# Patient Record
Sex: Male | Born: 1945 | Race: White | Hispanic: No | Smoking: Never smoker
Health system: Southern US, Community
[De-identification: ages and names within clinical notes are randomized; demographics above are authoritative.]

## PROBLEM LIST (undated history)

## (undated) DIAGNOSIS — G44309 Post-traumatic headache, unspecified, not intractable: Secondary | ICD-10-CM

## (undated) DIAGNOSIS — F419 Anxiety disorder, unspecified: Secondary | ICD-10-CM

## (undated) DIAGNOSIS — E785 Hyperlipidemia, unspecified: Secondary | ICD-10-CM

## (undated) DIAGNOSIS — I639 Cerebral infarction, unspecified: Secondary | ICD-10-CM

## (undated) DIAGNOSIS — F329 Major depressive disorder, single episode, unspecified: Secondary | ICD-10-CM

## (undated) DIAGNOSIS — H9190 Unspecified hearing loss, unspecified ear: Secondary | ICD-10-CM

## (undated) DIAGNOSIS — R630 Anorexia: Secondary | ICD-10-CM

## (undated) DIAGNOSIS — K5909 Other constipation: Secondary | ICD-10-CM

## (undated) DIAGNOSIS — K529 Noninfective gastroenteritis and colitis, unspecified: Secondary | ICD-10-CM

## (undated) DIAGNOSIS — K635 Polyp of colon: Secondary | ICD-10-CM

## (undated) DIAGNOSIS — I10 Essential (primary) hypertension: Secondary | ICD-10-CM

## (undated) DIAGNOSIS — F32A Depression, unspecified: Secondary | ICD-10-CM

## (undated) DIAGNOSIS — G4700 Insomnia, unspecified: Secondary | ICD-10-CM

## (undated) DIAGNOSIS — K219 Gastro-esophageal reflux disease without esophagitis: Secondary | ICD-10-CM

## (undated) DIAGNOSIS — S0990XS Unspecified injury of head, sequela: Secondary | ICD-10-CM

## (undated) DIAGNOSIS — M1990 Unspecified osteoarthritis, unspecified site: Secondary | ICD-10-CM

## (undated) DIAGNOSIS — R1310 Dysphagia, unspecified: Secondary | ICD-10-CM

## (undated) HISTORY — DX: Unspecified injury of head, sequela: G44.309

## (undated) HISTORY — DX: Gastro-esophageal reflux disease without esophagitis: K21.9

## (undated) HISTORY — PX: TONSILLECTOMY: SUR1361

## (undated) HISTORY — DX: Anorexia: R63.0

## (undated) HISTORY — DX: Hyperlipidemia, unspecified: E78.5

## (undated) HISTORY — DX: Other constipation: K59.09

## (undated) HISTORY — DX: Unspecified hearing loss, unspecified ear: H91.90

## (undated) HISTORY — PX: WISDOM TOOTH EXTRACTION: SHX21

## (undated) HISTORY — DX: Dysphagia, unspecified: R13.10

## (undated) HISTORY — DX: Unspecified injury of head, sequela: S09.90XS

## (undated) HISTORY — DX: Depression, unspecified: F32.A

## (undated) HISTORY — DX: Unspecified osteoarthritis, unspecified site: M19.90

## (undated) HISTORY — DX: Insomnia, unspecified: G47.00

## (undated) HISTORY — DX: Major depressive disorder, single episode, unspecified: F32.9

## (undated) HISTORY — DX: Polyp of colon: K63.5

## (undated) HISTORY — DX: Anxiety disorder, unspecified: F41.9

---

## 2009-05-24 DIAGNOSIS — M25559 Pain in unspecified hip: Secondary | ICD-10-CM

## 2011-02-20 DIAGNOSIS — M79606 Pain in leg, unspecified: Secondary | ICD-10-CM | POA: Insufficient documentation

## 2012-05-07 ENCOUNTER — Emergency Department (HOSPITAL_COMMUNITY)
Admission: EM | Admit: 2012-05-07 | Discharge: 2012-05-07 | Disposition: A | Discharge status: Home / Self Care | Payer: Medicare Other | Attending: Emergency Medicine | Admitting: Emergency Medicine

## 2012-05-07 ENCOUNTER — Encounter (HOSPITAL_COMMUNITY): Payer: Self-pay | Admitting: Emergency Medicine

## 2012-05-07 DIAGNOSIS — I10 Essential (primary) hypertension: Secondary | ICD-10-CM | POA: Insufficient documentation

## 2012-05-07 DIAGNOSIS — R51 Headache: Secondary | ICD-10-CM | POA: Insufficient documentation

## 2012-05-07 DIAGNOSIS — Z8673 Personal history of transient ischemic attack (TIA), and cerebral infarction without residual deficits: Secondary | ICD-10-CM | POA: Insufficient documentation

## 2012-05-07 HISTORY — DX: Cerebral infarction, unspecified: I63.9

## 2012-05-07 HISTORY — DX: Essential (primary) hypertension: I10

## 2012-05-07 HISTORY — DX: Noninfective gastroenteritis and colitis, unspecified: K52.9

## 2012-05-07 MED ORDER — AMLODIPINE BESYLATE 5 MG PO TABS
5.0000 mg | ORAL_TABLET | ORAL | Status: AC
  Administered 2012-05-07: 5 mg via ORAL
  Filled 2012-05-07: qty 1

## 2012-05-07 MED ORDER — AMLODIPINE BESYLATE 5 MG PO TABS: 5.0000 mg | ORAL_TABLET | Freq: Every day | ORAL | Status: DC

## 2012-05-07 MED ORDER — OXYCODONE-ACETAMINOPHEN 5-325 MG PO TABS
1.0000 | ORAL_TABLET | Freq: Once | ORAL | Status: AC
  Administered 2012-05-07: 1 via ORAL
  Filled 2012-05-07: qty 1

## 2012-05-07 NOTE — ED Notes (Signed)
Patient reports blood pressure has become high over the last month.  Patient saw primary care provider yesterday and PCP told pt. To come to ED.  Pt. Took his BP this morning and it was 205/109.  Pt. Started zoloft prescription last week for two days and developed a rash and shortness of breath.  Pt. Stopped taking Zoloft after two days.  Pt. Reports worsening tinnitus and headache.

## 2012-05-07 NOTE — ED Notes (Addendum)
Pt reports HTN that "has been giving me problems over last few weeks." C/o HA, ringing in ears. Sts has been running higher than usual over last few days. 205/103 this am. Also reports pulse 95, sts usually is 65-70. Also reports back pain, which is not new per pt.

## 2012-05-07 NOTE — Progress Notes (Signed)
Pt confirms pcp as michael badger

## 2012-05-07 NOTE — ED Provider Notes (Signed)
History     CSN: [id number]  Arrival date & time 05/07/12  1201   First MD Initiated Contact with Patient 05/07/12 1606      Chief Complaint  Patient presents with  . Hypertension    The history is provided by the patient.   the patient reports high blood pressure yesterday and today when monitoring it at home.  He is taking his Cozaar as prescribed.  He reports he exercises daily and takes a low-sodium diet.  He denies chest pain shortness of breath.  He reports mild headache but reports the headache is not atypical for him.  He denies weakness of his upper lower extremities.  His had no gait instability.  He denies abdominal pain or back pain.  He recently finished a course of antibiotics for possible pneumonia.  His blood pressure at home today was 205/109.  He is asymptomatic at that time.  Blood pressure on arrival to the emergency apartment was 194/99.  Patient without current plates at this time  Past Medical History  Diagnosis Date  . Hypertension   . Stroke   . Colitis     Past Surgical History  Procedure Date  . Tonsillectomy     Family History  Problem Relation Age of Onset  . Cancer Father   . Heart failure Father     History  Substance Use Topics  . Smoking status: Never Smoker   . Smokeless tobacco: Not on file  . Alcohol Use: 1.2 oz/week    2 Cans of beer per week      Review of Systems  All other systems reviewed and are negative.    Allergies  Zoloft and Statins  Home Medications   Current Outpatient Rx  Name Route Sig Dispense Refill  . ASPIRIN 81 MG PO CHEW Oral Chew 81 mg by mouth daily.    Marland Kitchen BACLOFEN 10 MG PO TABS Oral Take 10 mg by mouth 4 (four) times daily.    Marland Kitchen CLONAZEPAM 0.5 MG PO TABS Oral Take 0.5 mg by mouth 2 (two) times daily as needed. For anxiety    . CLOPIDOGREL BISULFATE 75 MG PO TABS Oral Take 75 mg by mouth every evening.    . CO Q 10 100 MG PO CAPS Oral Take 1 capsule by mouth 2 (two) times daily.    Marland Kitchen DOCUSATE SODIUM  100 MG PO CAPS Oral Take 100 mg by mouth daily as needed. For constipation with vicodin    . GABAPENTIN 100 MG PO CAPS Oral Take 100 mg by mouth 3 (three) times daily.    Marland Kitchen HYDROCODONE-ACETAMINOPHEN 5-500 MG PO TABS Oral Take 1 tablet by mouth every 4 (four) hours as needed. For pain    . LOSARTAN POTASSIUM 100 MG PO TABS Oral Take 100 mg by mouth every morning.    Marland Kitchen PROSTATE HEALTH PO CAPS Oral Take 1 capsule by mouth 2 (two) times daily.    . ADULT MULTIVITAMIN W/MINERALS CH Oral Take 1 tablet by mouth daily.    . NYSTATIN 100000 UNIT/GM EX CREA Topical Apply 1 application topically 2 (two) times daily. Apply to sores on feet    . SALMON OIL-1000 PO Oral Take 1,000 mg by mouth daily.    Marland Kitchen OVER THE COUNTER MEDICATION Oral Take 1 capsule by mouth daily. Gamma Tocopherol: vit E 200iu, d-gamma750mg , d-delta 290mg     . OVER THE COUNTER MEDICATION Oral Take 1 capsule by mouth daily. Veg Cap: Beta-Sitosterol with zinc and selenium    .  RED YEAST RICE EXTRACT 600 MG PO CAPS Oral Take 1 capsule by mouth 2 (two) times daily.    Marland Kitchen ZOLPIDEM TARTRATE 10 MG PO TABS Oral Take 10 mg by mouth at bedtime as needed. For sleep    . AMLODIPINE BESYLATE 5 MG PO TABS Oral Take 1 tablet (5 mg total) by mouth daily. 30 tablet 0    BP 186/93  Pulse 63  Temp 98 F (36.7 C) (Oral)  Resp 18  Ht 5\' 11"  (1.803 m)  Wt 145 lb (65.772 kg)  BMI 20.22 kg/m2  SpO2 99%  Physical Exam  Nursing note and vitals reviewed. Constitutional: He is oriented to person, place, and time. He appears well-developed and well-nourished.  HENT:  Head: Normocephalic and atraumatic.  Eyes: EOM are normal.  Neck: Normal range of motion.  Cardiovascular: Normal rate, regular rhythm, normal heart sounds and intact distal pulses.   Pulmonary/Chest: Effort normal and breath sounds normal. No respiratory distress.  Abdominal: Soft. He exhibits no distension. There is no tenderness.  Musculoskeletal: Normal range of motion.  Neurological: He  is alert and oriented to person, place, and time.  Skin: Skin is warm and dry.  Psychiatric: He has a normal mood and affect. Judgment normal.    ED Course  Procedures (including critical care time)  Labs Reviewed - No data to display No results found.   1. Hypertension       MDM  The patient has hypertension without clinical evidence of endorgan damage.  I'll start the patient on Norvasc in addition to his Cozaar.  PCP followup next week for blood pressure recheck.        Lyanne Co, MD 05/07/12 [phone]

## 2012-08-16 ENCOUNTER — Encounter: Payer: Self-pay | Admitting: Physical Medicine & Rehabilitation

## 2012-09-10 ENCOUNTER — Encounter: Payer: Self-pay | Admitting: Physical Medicine & Rehabilitation

## 2012-09-10 ENCOUNTER — Ambulatory Visit (HOSPITAL_BASED_OUTPATIENT_CLINIC_OR_DEPARTMENT_OTHER): Payer: Medicare Other | Admitting: Physical Medicine & Rehabilitation

## 2012-09-10 ENCOUNTER — Encounter: Payer: Medicare Other | Attending: Physical Medicine & Rehabilitation

## 2012-09-10 VITALS — BP 156/99 | HR 74 | Resp 14 | Ht 71.0 in | Wt 144.0 lb

## 2012-09-10 DIAGNOSIS — G890 Central pain syndrome: Secondary | ICD-10-CM

## 2012-09-10 DIAGNOSIS — Z5181 Encounter for therapeutic drug level monitoring: Secondary | ICD-10-CM

## 2012-09-10 DIAGNOSIS — I69959 Hemiplegia and hemiparesis following unspecified cerebrovascular disease affecting unspecified side: Secondary | ICD-10-CM | POA: Insufficient documentation

## 2012-09-10 DIAGNOSIS — G8110 Spastic hemiplegia affecting unspecified side: Secondary | ICD-10-CM

## 2012-09-10 DIAGNOSIS — R52 Pain, unspecified: Secondary | ICD-10-CM

## 2012-09-10 MED ORDER — TRAMADOL HCL 50 MG PO TABS: 50.0000 mg | ORAL_TABLET | Freq: Four times a day (QID) | ORAL | Status: DC | PRN

## 2012-09-10 NOTE — Progress Notes (Signed)
Subjective:    Patient ID: Carlos Grimes, male    DOB: 1946/03/27, 66 y.o.   MRN: [id number] CC Right arm pain since stroke HPI Secondary complaint is right ankle stiffness since stroke Stroke onset 01/02/2005.  Treated in [facility] Inpatient rehabilitation for 20 days Had outpatient therapy for a time after discharge from rehabilitation unit Complaints of spasticity on the right side Ambulates with a cane Has not fallen in years  Tried gabapentin no effect, had bad dreams Amitriptyline causing bowel and urination trouble   Pain Inventory Average Pain 4 Pain Right Now 8 My pain is intermittent, constant, sharp, dull, stabbing, tingling and aching  In the last 24 hours, has pain interfered with the following? General activity 7 Relation with others 3 Enjoyment of life 8 What TIME of day is your pain at its worst? evening and night Sleep (in general) Fair  Pain is worse with: some activites Pain improves with: rest and medication Relief from Meds: 6  Mobility use a cane how many minutes can you walk? 15 ability to climb steps?  yes do you drive?  yes transfers alone Do you have any goals in this area?  no  Function disabled: date disabled 04/08 I need assistance with the following:  meal prep, household duties and shopping Do you have any goals in this area?  yes  Neuro/Psych bladder control problems weakness numbness tremor tingling trouble walking spasms depression anxiety suicidal thoughts  Prior Studies CT/MRI  Physicians involved in your care Primary care    Family History  Problem Relation Age of Onset  . Cancer Father   . Heart failure Father   . Alcohol abuse Father    History   Social History  . Marital Status: Married    Spouse Name: N/A    Number of Children: N/A  . Years of Education: N/A   Social History Main Topics  . Smoking status: Never Smoker   . Smokeless tobacco: Never Used  . Alcohol Use: 1.2 oz/week    2  Cans of beer per week  . Drug Use: No  . Sexually Active: None   Other Topics Concern  . None   Social History Narrative  . None   Past Surgical History  Procedure Date  . Tonsillectomy   . Wisdom tooth extraction    Past Medical History  Diagnosis Date  . Hypertension   . Stroke   . Colitis   . Depression   . Arthritis    BP 156/99  Pulse 74  Resp 14  Ht 5\' 11"  (1.803 m)  Wt 144 lb (65.318 kg)  BMI 20.08 kg/m2  SpO2 99%     Review of Systems  Musculoskeletal: Positive for gait problem.  Neurological: Positive for tremors, weakness and numbness.  Psychiatric/Behavioral: Positive for suicidal ideas and dysphoric mood. The patient is nervous/anxious.   All other systems reviewed and are negative.       Objective:   Physical Exam  Constitutional: He is oriented to person, place, and time. He appears well-developed and well-nourished.  HENT:  Head: Normocephalic and atraumatic.  Eyes: Conjunctivae normal and EOM are normal. Pupils are equal, round, and reactive to light.  Neck: Normal range of motion. Neck supple.  Cardiovascular: Normal rate and regular rhythm.   Pulmonary/Chest: Effort normal and breath sounds normal.  Abdominal: Soft. Bowel sounds are normal.  Neurological: He is alert and oriented to person, place, and time.  Reflex Scores:      Tricep reflexes are  3+ on the right side and 2+ on the left side.      Bicep reflexes are 3+ on the right side and 2+ on the left side.      Brachioradialis reflexes are 3+ on the right side and 2+ on the left side.      Patellar reflexes are 3+ on the right side and 2+ on the left side.      Achilles reflexes are 3+ on the right side and 2+ on the left side.      4/5 R Delt, bi , tri, grip, HF, KE, ankle DF/PF 5/5 on Left side Sensation reduced on the RIght side ambulation  Psychiatric: He has a normal mood and affect.          Assessment & Plan:  1. 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 Do not think narcotic analgesics are the best treatment option for this patient. First there is really no literature to support this, second he has elevated opioid risk total score, third he's had complications with constipation. Recommend combination treatment tramadol 50 mg 3 times a day, baclofen 10 mg 3 times a day May need to adjust from there. Zoloft listed as allergy However patient is tolerating amitriptyline which has some serotonergic effect  I'll see him in 3 weeks. He is to call if he has any intolerance of the tramadol He may stop the hydrocodone. He is on such a small dose 2.5 mg twice a day, and that I do not anticipate any withdrawal reaction

## 2012-09-10 NOTE — Patient Instructions (Addendum)
Please call if you cannot tolerate your medication change Please take your baclofen 3 times per day every day I can refill your baclofen once he starts running out Your family doctor will take care of all your other medicines I do not recommend hydrocodone.

## 2012-10-04 ENCOUNTER — Encounter: Payer: Self-pay | Admitting: Physical Medicine & Rehabilitation

## 2012-10-04 ENCOUNTER — Encounter: Payer: Medicare Other | Attending: Physical Medicine & Rehabilitation

## 2012-10-04 ENCOUNTER — Ambulatory Visit (HOSPITAL_BASED_OUTPATIENT_CLINIC_OR_DEPARTMENT_OTHER): Payer: Medicare Other | Admitting: Physical Medicine & Rehabilitation

## 2012-10-04 VITALS — BP 135/72 | HR 83 | Resp 14 | Ht 71.0 in | Wt 145.0 lb

## 2012-10-04 DIAGNOSIS — G890 Central pain syndrome: Secondary | ICD-10-CM

## 2012-10-04 DIAGNOSIS — G8110 Spastic hemiplegia affecting unspecified side: Secondary | ICD-10-CM

## 2012-10-04 DIAGNOSIS — I69959 Hemiplegia and hemiparesis following unspecified cerebrovascular disease affecting unspecified side: Secondary | ICD-10-CM | POA: Insufficient documentation

## 2012-10-04 MED ORDER — TRAMADOL HCL 50 MG PO TABS: 50.0000 mg | ORAL_TABLET | Freq: Three times a day (TID) | ORAL | Status: DC | PRN

## 2012-10-04 MED ORDER — BACLOFEN 10 MG PO TABS: 10.0000 mg | ORAL_TABLET | Freq: Three times a day (TID) | ORAL | Status: DC

## 2012-10-04 NOTE — Patient Instructions (Signed)
Continue baclofen 10 mg 3 times per day Continue tramadol 50 mg 3 times per day You have 5 refills on your prescription See me in 6 months

## 2012-10-04 NOTE — Progress Notes (Signed)
Subjective:    Patient ID: Carlos Grimes, male    DOB: [birth year], 67 y.o.   MRN: [id number] Chief complaint right arm pain HPI Secondary complaint is right ankle stiffness since stroke Stroke onset 01/02/2005.  Treated in [facility] Inpatient rehabilitation for 20 days Had outpatient therapy for a time after discharge from rehabilitation unit Complaints of spasticity on the right side Ambulates with a cane Has not fallen in years  Tried gabapentin no effect, had bad dreams  Interval history. Started on tramadol 50 mg 3 times a day as well as baclofen 10 mg 3 times a day. The patient states this combination has been the best thus far.Last visit the pain range is 4-8/10 Pain Inventory Average Pain 2 Pain Right Now 2 My pain is intermittent, sharp, dull and aching  In the last 24 hours, has pain interfered with the following? General activity 0 Relation with others 0 Enjoyment of life 0 What TIME of day is your pain at its worst? evening and night Sleep (in general) Fair  Pain is worse with: bending and standing Pain improves with: rest and medication Relief from Meds: 9  Mobility use a cane how many minutes can you walk? 30 ability to climb steps?  yes do you drive?  yes  Function retired I need assistance with the following:  feeding, meal prep and shopping  Neuro/Psych bladder control problems weakness numbness tremor tingling spasms confusion depression anxiety  Prior Studies Any changes since last visit?  no  Physicians involved in your care Any changes since last visit?  no   Family History  Problem Relation Age of Onset  . Cancer Father   . Heart failure Father   . Alcohol abuse Father    History   Social History  . Marital Status: Married    Spouse Name: N/A    Number of Children: N/A  . Years of Education: N/A   Social History Main Topics  . Smoking status: Never Smoker   . Smokeless tobacco: Never Used     Comment: Smokes  marijuana  . Alcohol Use: 1.2 oz/week    2 Cans of beer per week  . Drug Use: Yes     Comment: marijauana  . Sexually Active: None   Other Topics Concern  . None   Social History Narrative  . None   Past Surgical History  Procedure Date  . Tonsillectomy   . Wisdom tooth extraction    Past Medical History  Diagnosis Date  . Hypertension   . Stroke   . Colitis   . Depression   . Arthritis    BP 135/72  Pulse 83  Resp 14  Ht 5\' 11"  (1.803 m)  Wt 145 lb (65.772 kg)  BMI 20.22 kg/m2  SpO2 98%     Review of Systems  Musculoskeletal: Positive for back pain.  Neurological: Positive for numbness.  Psychiatric/Behavioral: Positive for confusion and dysphoric mood. The patient is nervous/anxious.   All other systems reviewed and are negative.       Objective:   Physical Exam Eyes: Conjunctivae normal and EOM are normal. Pupils are equal, round, and reactive to light.  Neck: Normal range of motion. Neck supple.  Cardiovascular: Normal rate and regular rhythm.   Pulmonary/Chest: Effort normal and breath sounds normal.  Abdominal: Soft. Bowel sounds are normal.  Neurological: He is alert and oriented to person, place, and time.  Reflex Scores:      Tricep reflexes are 3+ on the right side  and 2+ on the left side.      Bicep reflexes are 3+ on the right side and 2+ on the left side.      Brachioradialis reflexes are 3+ on the right side and 2+ on the left side.      Patellar reflexes are 3+ on the right side and 2+ on the left side.      Achilles reflexes are 3+ on the right side and 2+ on the left side.      4/5 R Delt, bi , tri, grip, HF, KE, ankle DF/PF 5/5 on Left side Reduced sensation on R       Assessment & Plan:  1. 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 Do not think narcotic analgesics are the best treatment option for this patient. First there is really no literature to support this, second he has elevated  opioid risk total score, third he's had complications with constipation. Recommend combination treatment tramadol 50 mg 3 times a day, baclofen 10 mg 3 times a day May need to adjust from there.   RTC 6 mo

## 2013-02-22 ENCOUNTER — Encounter: Payer: Self-pay | Admitting: Family Medicine

## 2013-02-22 ENCOUNTER — Ambulatory Visit (INDEPENDENT_AMBULATORY_CARE_PROVIDER_SITE_OTHER): Payer: Medicare Other | Admitting: Family Medicine

## 2013-02-22 VITALS — BP 121/74 | HR 83 | Temp 97.6°F | Ht 71.0 in | Wt 145.4 lb

## 2013-02-22 DIAGNOSIS — R4702 Dysphasia: Secondary | ICD-10-CM

## 2013-02-22 DIAGNOSIS — I6350 Cerebral infarction due to unspecified occlusion or stenosis of unspecified cerebral artery: Secondary | ICD-10-CM

## 2013-02-22 DIAGNOSIS — E785 Hyperlipidemia, unspecified: Secondary | ICD-10-CM

## 2013-02-22 DIAGNOSIS — R4789 Other speech disturbances: Secondary | ICD-10-CM

## 2013-02-22 DIAGNOSIS — G894 Chronic pain syndrome: Secondary | ICD-10-CM

## 2013-02-22 DIAGNOSIS — I639 Cerebral infarction, unspecified: Secondary | ICD-10-CM

## 2013-02-22 DIAGNOSIS — N400 Enlarged prostate without lower urinary tract symptoms: Secondary | ICD-10-CM

## 2013-02-22 DIAGNOSIS — K5900 Constipation, unspecified: Secondary | ICD-10-CM

## 2013-02-22 DIAGNOSIS — G4700 Insomnia, unspecified: Secondary | ICD-10-CM

## 2013-02-22 DIAGNOSIS — I10 Essential (primary) hypertension: Secondary | ICD-10-CM

## 2013-02-22 DIAGNOSIS — M1990 Unspecified osteoarthritis, unspecified site: Secondary | ICD-10-CM

## 2013-02-22 MED ORDER — TAMSULOSIN HCL 0.4 MG PO CAPS: 0.4000 mg | ORAL_CAPSULE | Freq: Every day | ORAL | Status: DC

## 2013-02-22 MED ORDER — OMEPRAZOLE 20 MG PO CPDR: 20.0000 mg | DELAYED_RELEASE_CAPSULE | Freq: Every day | ORAL | Status: DC

## 2013-02-22 NOTE — Patient Instructions (Signed)

## 2013-02-22 NOTE — Progress Notes (Signed)
  Subjective:    Patient ID: Carlos Grimes, male    DOB: 12-11-1945, 67 y.o.   MRN: [id number]  HPI This 67 y.o. male presents for evaluation of recent hospitalization.  He has been hospitalized according to patient.  He lost 14 pounds and has regained the weight since stopping the amitriptyline.   He states he had a rxn to amitriptyline and has DC'd it since.  He was hospitalized at [facility]. He has hx of spastic right hemiplegia and CVA in 2006. He has hx of central pain syndrome and is seeing pain management.  He has hx of hypertension.  He is taking red rice yeast for hyperlipidemia. He has allergy to statin. He has hx of OA.  He has hx of constipation. He c/o nocturia and dysphasia.     Review of Systems  Constitutional: Positive for fatigue.  Eyes: Negative.   Respiratory: Negative.   Cardiovascular: Negative.   Gastrointestinal: Positive for constipation.  Genitourinary: Positive for frequency.  Musculoskeletal: Positive for arthralgias.  Neurological: Positive for weakness and headaches.        Objective:   Physical Exam  Constitutional: He is oriented to person, place, and time. He appears well-developed and well-nourished.  HENT:  Head: Normocephalic and atraumatic.  Eyes: Conjunctivae are normal. Pupils are equal, round, and reactive to light.  Neck: Normal range of motion. Neck supple.  Cardiovascular: Normal rate and regular rhythm.   Pulmonary/Chest: Effort normal.  Abdominal: Soft. Bowel sounds are normal.  Neurological: He is alert and oriented to person, place, and time. He displays abnormal reflex. Coordination abnormal.  Right hemiplegia           Assessment & Plan:  BPH (benign prostatic hyperplasia) - Plan: tamsulosin (FLOMAX) 0.4 MG CAPS,   Dysphasia - omeprazole 20mg  po qd, refer to GI  Unspecified constipation -Continue miralax and refer to GI for colonoscopy  CVA (cerebral infarction) - Continue Plavix, and baclofen for  spastic hemiplegia.  Essential hypertension, benign - Controlled with amlodipine  Other and unspecified hyperlipidemia - Intolerant to statins, continue red rice yeast otc  OA (osteoarthritis) - Continue Tramadol, hold NSAIDS since having GI problems  Chronic pain syndrome - Follow up with Pain management.  Continue cymbalta and tramadol and DC amitriptyline which he has already and is feeling a lot better since DC from hospital.  Insomnia - Use ambien for severe insomnia and continue sleep hygiene

## 2013-03-01 ENCOUNTER — Telehealth: Payer: Self-pay | Admitting: *Deleted

## 2013-03-01 ENCOUNTER — Encounter: Payer: Self-pay | Admitting: Gastroenterology

## 2013-03-01 NOTE — Telephone Encounter (Signed)
Current Clopidogrel (Plavix) prescribing information recommends avoiding concurrent use with omeprazole, due to the possibility that combined use may result in decreased clopidogrel effectiveness.   Currently available clinical trials have shown conflicting result regarding the significance of the decreased clopidogrel concentrations seen with omeprazole co-administration with regards to cardiac outcomes.   Taking into account the conflicting data, the potential risks of increased negative outcomes and mortality from concurrent omeprazole and clopidogrel use, and the availability of suitable alternatives I recommend a trial of pepcid 40mg  1 tablet daily

## 2013-03-01 NOTE — Telephone Encounter (Signed)
Please call patient at [phone] with directions.

## 2013-03-01 NOTE — Telephone Encounter (Signed)
Becky from [facility] called and stated that omeprazole and plavix have a potential interaction.  He has been taking these meds together for the past 5 days and the only thing he has noticed is a change in his headaches. He is going to hold the omeprazole until further notice.    Should he continue both meds or discontinue the omeprazole?

## 2013-03-02 NOTE — Telephone Encounter (Signed)
Hold omeprazole and take pepcid 20mg  po qd and do not stop plavix.

## 2013-03-02 NOTE — Telephone Encounter (Signed)
Patient aware.

## 2013-03-03 ENCOUNTER — Encounter: Payer: Self-pay | Admitting: *Deleted

## 2013-03-04 ENCOUNTER — Other Ambulatory Visit: Payer: Self-pay | Admitting: *Deleted

## 2013-03-04 MED ORDER — ZOLPIDEM TARTRATE 10 MG PO TABS: 10.0000 mg | ORAL_TABLET | Freq: Every evening | ORAL | Status: DC | PRN

## 2013-03-04 NOTE — Telephone Encounter (Signed)
Last seen 02/22/13, if approved route to nurse to call into [facility] [phone]

## 2013-03-07 ENCOUNTER — Other Ambulatory Visit: Payer: Self-pay | Admitting: *Deleted

## 2013-03-07 MED ORDER — DULOXETINE HCL 30 MG PO CPEP: 30.0000 mg | ORAL_CAPSULE | Freq: Every day | ORAL | Status: DC

## 2013-03-07 NOTE — Telephone Encounter (Signed)
Called in.

## 2013-03-10 ENCOUNTER — Encounter: Payer: Self-pay | Admitting: Gastroenterology

## 2013-03-10 ENCOUNTER — Ambulatory Visit (INDEPENDENT_AMBULATORY_CARE_PROVIDER_SITE_OTHER): Payer: Medicare Other | Admitting: Gastroenterology

## 2013-03-10 VITALS — BP 110/80 | HR 63 | Ht 71.0 in | Wt 146.5 lb

## 2013-03-10 DIAGNOSIS — G894 Chronic pain syndrome: Secondary | ICD-10-CM

## 2013-03-10 DIAGNOSIS — Z7901 Long term (current) use of anticoagulants: Secondary | ICD-10-CM

## 2013-03-10 DIAGNOSIS — K5900 Constipation, unspecified: Secondary | ICD-10-CM

## 2013-03-10 NOTE — Progress Notes (Signed)
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  There is been problem with post stroke pain management, and he is under the care of of a local pain management clinic and is on multiple medications listed and reviewed his chart. He was recently hospitalized in [place] because of a drug reaction from Elavil, was treated appropriately , discharged, and is now gaining weight and to me he denies any gastrointestinal symptoms.  He has some pill dysphagia but he uses plenty of liquids, denies true solid food dysphagia, any history of acid reflux, dyspepsia, or lower gastrointestinal problems.  He had colonoscopy apparently in [place] in 2007 with removal of a hyperplastic polyp.  He was apparently told that he needed followup colonoscopy in 10 years time.  He has some mild constipation relieved with fiber, and denies melena or hematochezia.  He denies a history of hepatobiliary problems, has no hepatobiliary symptomatology.  Other medical problems have included chronic headaches, anxiety and depression, chronic pain syndrome, and hypertension with previous CVA.  Currently is under the care of [facility].  I have reviewed this patient's present history, medical and surgical past history, allergies and medications.     ROS:   All systems were reviewed and are negative unless otherwise stated in the HPI.  Allergies  Allergen Reactions  . Zoloft (Sertraline Hcl) Shortness Of Breath, Rash and Other (See Comments)    Tightness in chest, chest pain  . Amitriptyline Other (See Comments)    Caused poisioning in system  . Cymbalta (Duloxetine Hcl) Other (See Comments)    Unable to urinate  . Gabapentin     Dream  . Statins     Joint, pain   Outpatient Prescriptions Prior to Visit  Medication Sig Dispense Refill  . amLODipine (NORVASC) 5 MG tablet Take 10 mg by mouth daily.      .  Ashwagandha Extract 2.5 % POWD by Does not apply route.      Marland Kitchen aspirin 81 MG chewable tablet Chew 81 mg by mouth daily.      . baclofen (LIORESAL) 10 MG tablet Take 1 tablet (10 mg total) by mouth 3 (three) times daily.  90 each  5  . clonazePAM (KLONOPIN) 0.5 MG tablet Take 1 mg by mouth 3 (three) times daily as needed. For anxiety      . clopidogrel (PLAVIX) 75 MG tablet Take 75 mg by mouth every evening.      . Coenzyme Q10 (CO Q 10) 100 MG CAPS Take 1 capsule by mouth 2 (two) times daily.      . Misc Natural Products (PROSTATE HEALTH) CAPS Take 1 capsule by mouth 2 (two) times daily.      . Multiple Vitamin (MULTIVITAMIN WITH MINERALS) TABS Take 1 tablet by mouth daily.      Marland Kitchen nystatin cream (MYCOSTATIN) Apply 1 application topically 2 (two) times daily. Apply to sores on feet      . Omega-3 Fatty Acids (SALMON OIL-1000 PO) Take 1,000 mg by mouth daily.      Marland Kitchen OVER THE COUNTER MEDICATION Take 1 capsule by mouth daily. Gamma Tocopherol: vit E 200iu, d-gamma750mg , d-delta 290mg       . OVER THE COUNTER MEDICATION Take 1 capsule by mouth daily. Veg Cap: Beta-Sitosterol with zinc and selenium      . OVER THE COUNTER MEDICATION Prostate essentials plus      . polyethylene glycol  powder (GLYCOLAX/MIRALAX) powder       . Red Yeast Rice Extract 600 MG CAPS Take 1 capsule by mouth 2 (two) times daily.      . tamsulosin (FLOMAX) 0.4 MG CAPS Take 1 capsule (0.4 mg total) by mouth daily.  30 capsule  5  . traMADol (ULTRAM) 50 MG tablet Take 1 tablet (50 mg total) by mouth every 8 (eight) hours as needed for pain.  90 tablet  5  . VALERIAN ROOT PO Take by mouth.      . zolpidem (AMBIEN) 10 MG tablet Take 1 tablet (10 mg total) by mouth at bedtime as needed. For sleep  30 tablet  0  . amitriptyline (ELAVIL) 25 MG tablet       . docusate sodium (COLACE) 100 MG capsule Take 100 mg by mouth daily as needed. For constipation with vicodin      . DULoxetine (CYMBALTA) 30 MG capsule Take 1 capsule (30 mg total)  by mouth daily.  30 capsule  2  . omeprazole (PRILOSEC) 20 MG capsule Take 1 capsule (20 mg total) by mouth daily.  30 capsule  3   No facility-administered medications prior to visit.   Past Medical History  Diagnosis Date  . Hypertension   . Stroke   . Colitis   . Depression   . Arthritis   . Anxiety   . Insomnia, unspecified   . GERD (gastroesophageal reflux disease)   . Trouble swallowing   . Loss of appetite   . Chronic constipation   . Hearing loss   . Headaches due to old head injury   . Colon polyp   . Hyperlipidemia    Past Surgical History  Procedure Laterality Date  . Tonsillectomy    . Wisdom tooth extraction     History   Social History  . Marital Status: Married    Spouse Name: N/A    Number of Children: 5  . Years of Education: N/A   Occupational History  . retired    Social History Main Topics  . Smoking status: Never Smoker   . Smokeless tobacco: Never Used     Comment: Smokes marijuana  . Alcohol Use: 1.2 oz/week    2 Cans of beer per week  . Drug Use: Yes    Special: Marijuana     Comment: marijauana  . Sexually Active: None   Other Topics Concern  . None   Social History Narrative  . None   Family History  Problem Relation Age of Onset  . Cancer Father   . Heart failure Father   . Diabetes Maternal Grandmother        Physical Exam: Blood pressure 121/74, pulse 83 and regular and weight 145 with a BMI of 20.29. General well developed well nourished patient in no acute distress, appearing their stated age Eyes PERRLA, no icterus, fundoscopic exam per opthamologist Skin no lesions noted Neck supple, no adenopathy, no thyroid enlargement, no tenderness Chest clear to percussion and auscultation Heart no significant murmurs, gallops or rubs noted Abdomen no hepatosplenomegaly masses or tenderness, BS normal.  Rectal inspection normal no fissures, or fistulae noted.  No masses or tenderness on digital exam. Stool guaiac  negative. Extremities no acute joint lesions, edema, phlebitis or evidence of cellulitis. Neurologic patient oriented x 3, cranial nerves intact, no focal neurologic deficits noted. Psychological mental status normal and normal affect.  Assessment and plan: I've advised patient to follow a high fiber diet and to use  daily Benefiber with liberal by mouth fluids for his mild constipation.  He is on multiple medications, but does not seem to be having any specific GI complications at this time.  I do not think he needs empiric endoscopy or colonoscopy at this point.  He apparently also uses periodic MiraLax for constipation and probiotics.  I told him to call if he develops other GI problems or starts having any significant weight loss that required endoscopic or colonoscopy exams.  He seems to be satisfied with this consultation today. No diagnosis found.

## 2013-03-10 NOTE — Patient Instructions (Signed)
Information on high fiber diet is below. Fiber Supplements sheet given today for your review. Please purchase Benefiber over the counter and use one tablespoon twice daily. Please follow up in one year with Dr. Jarold Motto. ______________________________________     High-Fiber Diet Fiber is found in fruits, vegetables, and grains. A high-fiber diet encourages the addition of more whole grains, legumes, fruits, and vegetables in your diet. The recommended amount of fiber for adult males is 38 g per day. For adult females, it is 25 g per day. Pregnant and lactating women should get 28 g of fiber per day. If you have a digestive or bowel problem, ask your caregiver for advice before adding high-fiber foods to your diet. Eat a variety of high-fiber foods instead of only a select few type of foods.  PURPOSE  To increase stool bulk.  To make bowel movements more regular to prevent constipation.  To lower cholesterol.  To prevent overeating. WHEN IS THIS DIET USED?  It may be used if you have constipation and hemorrhoids.  It may be used if you have uncomplicated diverticulosis (intestine condition) and irritable bowel syndrome.  It may be used if you need help with weight management.  It may be used if you want to add it to your diet as a protective measure against atherosclerosis, diabetes, and cancer. SOURCES OF FIBER  Whole-grain breads and cereals.  Fruits, such as apples, oranges, bananas, berries, prunes, and pears.  Vegetables, such as green peas, carrots, sweet potatoes, beets, broccoli, cabbage, spinach, and artichokes.  Legumes, such split peas, soy, lentils.  Almonds. FIBER CONTENT IN FOODS Starches and Grains / Dietary Fiber (g)  Cheerios, 1 cup / 3 g  Corn Flakes cereal, 1 cup / 0.7 g  Rice crispy treat cereal, 1 cup / 0.3 g  Instant oatmeal (cooked),  cup / 2 g  Frosted wheat cereal, 1 cup / 5.1 g  Brown, long-grain rice (cooked), 1 cup / 3.5 g  White,  long-grain rice (cooked), 1 cup / 0.6 g  Enriched macaroni (cooked), 1 cup / 2.5 g Legumes / Dietary Fiber (g)  Baked beans (canned, plain, or vegetarian),  cup / 5.2 g  Kidney beans (canned),  cup / 6.8 g  Pinto beans (cooked),  cup / 5.5 g Breads and Crackers / Dietary Fiber (g)  Plain or honey graham crackers, 2 squares / 0.7 g  Saltine crackers, 3 squares / 0.3 g  Plain, salted pretzels, 10 pieces / 1.8 g  Whole-wheat bread, 1 slice / 1.9 g  White bread, 1 slice / 0.7 g  Raisin bread, 1 slice / 1.2 g  Plain bagel, 3 oz / 2 g  Flour tortilla, 1 oz / 0.9 g  Corn tortilla, 1 small / 1.5 g  Hamburger or hotdog bun, 1 small / 0.9 g Fruits / Dietary Fiber (g)  Apple with skin, 1 medium / 4.4 g  Sweetened applesauce,  cup / 1.5 g  Banana,  medium / 1.5 g  Grapes, 10 grapes / 0.4 g  Orange, 1 small / 2.3 g  Raisin, 1.5 oz / 1.6 g  Melon, 1 cup / 1.4 g Vegetables / Dietary Fiber (g)  Green beans (canned),  cup / 1.3 g  Carrots (cooked),  cup / 2.3 g  Broccoli (cooked),  cup / 2.8 g  Peas (cooked),  cup / 4.4 g  Mashed potatoes,  cup / 1.6 g  Lettuce, 1 cup / 0.5 g  Corn (canned),  cup /  1.6 g  Tomato,  cup / 1.1 g Document Released: 09/01/2005 Document Revised: 03/02/2012 Document Reviewed: 12/04/2011 ExitCare Patient Information 2014 [employer]. ______________________________________________________________                                               We are excited to introduce MyChart, a new best-in-class service that provides you online access to important information in your electronic medical record. We want to make it easier for you to view your health information - all in one secure location - when and where you need it. We expect MyChart will enhance the quality of care and service we provide.  When you register for MyChart, you can:    View your test results.    Request appointments and receive appointment reminders via  email.    Request medication renewals.    View your medical history, allergies, medications and immunizations.    Communicate with your physician's office through a password-protected site.    Conveniently print information such as your medication lists.  To find out if MyChart is right for you, please talk to a member of our clinical staff today. We will gladly answer your questions about this free health and wellness tool.  If you are age 67 or older and want a member of your family to have access to your record, you must provide written consent by completing a proxy form available at our office. Please speak to our clinical staff about guidelines regarding accounts for patients younger than age 7.  As you activate your MyChart account and need any technical assistance, please call the MyChart technical support line at [phone]) or email your question to [email or web address]. If you email your question(s), please include your name, a return phone number and the best time to reach you.  If you have non-urgent health-related questions, you can send a message to our office through MyChart at [email or web address]. If you have a medical emergency, call 911.  Thank you for using MyChart as your new health and wellness resource!   MyChart licensed from [employer],  [id number]. Patents Pending.

## 2013-03-21 ENCOUNTER — Other Ambulatory Visit: Payer: Self-pay

## 2013-03-21 ENCOUNTER — Other Ambulatory Visit: Payer: Self-pay | Admitting: Family Medicine

## 2013-03-21 MED ORDER — CLONAZEPAM 0.5 MG PO TABS: 1.0000 mg | ORAL_TABLET | Freq: Three times a day (TID) | ORAL | Status: DC | PRN

## 2013-03-21 NOTE — Telephone Encounter (Signed)
Rx is printed off, he can pick up or we can call in and follow up in 3 months or sooner if needing another refill.

## 2013-03-21 NOTE — Telephone Encounter (Signed)
Last seen 02/22/13   If approved call in and have nurse notify patient

## 2013-03-23 NOTE — Telephone Encounter (Signed)
Called in.

## 2013-03-29 ENCOUNTER — Encounter: Payer: Medicare Other | Admitting: Physical Medicine & Rehabilitation

## 2013-04-11 ENCOUNTER — Other Ambulatory Visit: Payer: Self-pay

## 2013-04-11 NOTE — Telephone Encounter (Signed)
Last filled 03/04/13  Last seen 02/22/13  [name]   If approved phone in and route to nurse

## 2013-04-12 ENCOUNTER — Other Ambulatory Visit: Payer: Self-pay | Admitting: Family Medicine

## 2013-04-12 MED ORDER — ZOLPIDEM TARTRATE 10 MG PO TABS: 10.0000 mg | ORAL_TABLET | Freq: Every evening | ORAL | Status: DC | PRN

## 2013-04-12 NOTE — Telephone Encounter (Signed)
Rx printed off and can come p/u or call in

## 2013-05-02 ENCOUNTER — Encounter: Payer: Medicare Other | Attending: Physical Medicine & Rehabilitation

## 2013-05-02 ENCOUNTER — Encounter: Payer: Self-pay | Admitting: Physical Medicine & Rehabilitation

## 2013-05-02 ENCOUNTER — Ambulatory Visit (HOSPITAL_BASED_OUTPATIENT_CLINIC_OR_DEPARTMENT_OTHER): Payer: Medicare Other | Admitting: Physical Medicine & Rehabilitation

## 2013-05-02 VITALS — BP 133/85 | HR 58 | Resp 14 | Ht 71.0 in | Wt 149.4 lb

## 2013-05-02 DIAGNOSIS — I69998 Other sequelae following unspecified cerebrovascular disease: Secondary | ICD-10-CM | POA: Insufficient documentation

## 2013-05-02 DIAGNOSIS — G8110 Spastic hemiplegia affecting unspecified side: Secondary | ICD-10-CM

## 2013-05-02 DIAGNOSIS — G890 Central pain syndrome: Secondary | ICD-10-CM

## 2013-05-02 DIAGNOSIS — M25673 Stiffness of unspecified ankle, not elsewhere classified: Secondary | ICD-10-CM | POA: Insufficient documentation

## 2013-05-02 DIAGNOSIS — M25676 Stiffness of unspecified foot, not elsewhere classified: Secondary | ICD-10-CM | POA: Insufficient documentation

## 2013-05-02 MED ORDER — BACLOFEN 10 MG PO TABS: 10.0000 mg | ORAL_TABLET | Freq: Three times a day (TID) | ORAL | Status: DC

## 2013-05-02 MED ORDER — TRAMADOL HCL 50 MG PO TABS: 50.0000 mg | ORAL_TABLET | Freq: Four times a day (QID) | ORAL | Status: DC | PRN

## 2013-05-02 NOTE — Progress Notes (Signed)
Subjective:    Patient ID: Carlos Grimes, male    DOB: 1945-09-21, 67 y.o.   MRN: [id number]  HPI Chief complaint right arm pain  HPI  Secondary complaint is right ankle stiffness since stroke  Stroke onset 01/02/2005. Treated in [facility]  Inpatient rehabilitation for 20 days  Had outpatient therapy for a time after discharge from rehabilitation unit  Complaints of spasticity on the right side  Ambulates with a cane  Has not fallen in years  Tried gabapentin no effect, had bad dreams  Interval history. Started on tramadol 50 mg 3 times a day as well as baclofen 10 mg 3 times a day. The patient states this combination has been the best thus far. Initial visit the pain range is 4-8/10  On benefiber and water which also helped back stopped miralax Mowing lawn on riding mower Walking for exercise Pain Inventory Average Pain 2 Pain Right Now 2 My pain is constant and nerve pain where stroke was  In the last 24 hours, has pain interfered with the following? General activity 5 Relation with others 5 Enjoyment of life 5 What TIME of day is your pain at its worst? morning Sleep (in general) Fair  Pain is worse with: walking Pain improves with: rest and medication Relief from Meds: 7  Mobility use a cane how many minutes can you walk? 30 ability to climb steps?  yes do you drive?  yes  Function retired  Neuro/Psych bladder control problems weakness numbness tremor tingling trouble walking spasms depression anxiety  Prior Studies Any changes since last visit?  no  Physicians involved in your care Any changes since last visit?  no   Family History  Problem Relation Age of Onset  . Cancer Father   . Heart failure Father   . Diabetes Maternal Grandmother    History   Social History  . Marital Status: Married    Spouse Name: N/A    Number of Children: 5  . Years of Education: N/A   Occupational History  . retired    Social History Main Topics   . Smoking status: Never Smoker   . Smokeless tobacco: Never Used     Comment: Smokes marijuana  . Alcohol Use: 1.2 oz/week    2 Cans of beer per week  . Drug Use: Yes    Special: Marijuana     Comment: marijauana  . Sexual Activity: None   Other Topics Concern  . None   Social History Narrative  . None   Past Surgical History  Procedure Laterality Date  . Tonsillectomy    . Wisdom tooth extraction     Past Medical History  Diagnosis Date  . Hypertension   . Stroke   . Colitis   . Depression   . Arthritis   . Anxiety   . Insomnia, unspecified   . GERD (gastroesophageal reflux disease)   . Trouble swallowing   . Loss of appetite   . Chronic constipation   . Hearing loss   . Headaches due to old head injury   . Colon polyp   . Hyperlipidemia    BP 133/85  Pulse 58  Resp 14  Ht 5\' 11"  (1.803 m)  Wt 149 lb 6.4 oz (67.767 kg)  BMI 20.85 kg/m2  SpO2 97%    Review of Systems  Musculoskeletal: Positive for gait problem.       Spasms  Neurological: Positive for tremors, weakness and numbness.       Tingling  Hematological: Bruises/bleeds easily.  Psychiatric/Behavioral: The patient is nervous/anxious.   All other systems reviewed and are negative.       Objective:   Physical Exam   Neurological: He is alert and oriented to person, place, and time.  Reflex Scores:  Tricep reflexes are 3+ on the right side and 2+ on the left side.  Bicep reflexes are 3+ on the right side and 2+ on the left side.  Brachioradialis reflexes are 3+ on the right side and 2+ on the left side.  Patellar reflexes are 3+ on the right side and 2+ on the left side.  Achilles reflexes are 3+ on the right side and 2+ on the left side. 4/5 R Delt, bi , tri, grip, HF, KE, ankle DF/PF 5/5 on Left side  Reduced sensation on R       Assessment & Plan:  1. 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  Do not think narcotic analgesics are  the best treatment option for this patient. First there is really no literature to support this, second he has elevated opioid risk total score, third he's had complications with constipation.  Recommend continued combination treatment tramadol 50 mg 4 times a day, baclofen 10 mg 3 times a day  Has been having pain between tramadol doses when taking every 8 hours therefore increased to 4 times per day

## 2013-05-11 ENCOUNTER — Ambulatory Visit (INDEPENDENT_AMBULATORY_CARE_PROVIDER_SITE_OTHER): Payer: Medicare Other | Admitting: Family Medicine

## 2013-05-11 ENCOUNTER — Encounter: Payer: Self-pay | Admitting: Family Medicine

## 2013-05-11 VITALS — BP 122/69 | HR 60 | Temp 97.7°F | Ht 71.0 in | Wt 149.0 lb

## 2013-05-11 DIAGNOSIS — R251 Tremor, unspecified: Secondary | ICD-10-CM

## 2013-05-11 DIAGNOSIS — T148 Other injury of unspecified body region: Secondary | ICD-10-CM

## 2013-05-11 DIAGNOSIS — R259 Unspecified abnormal involuntary movements: Secondary | ICD-10-CM

## 2013-05-11 DIAGNOSIS — W57XXXA Bitten or stung by nonvenomous insect and other nonvenomous arthropods, initial encounter: Secondary | ICD-10-CM

## 2013-05-11 MED ORDER — CLONAZEPAM 0.5 MG PO TABS: 1.0000 mg | ORAL_TABLET | Freq: Three times a day (TID) | ORAL | Status: DC | PRN

## 2013-05-11 NOTE — Progress Notes (Signed)
  Subjective:    Patient ID: Carlos Grimes, male    DOB: Apr 21, 1946, 67 y.o.   MRN: [id number]  HPI  This 67 y.o. male presents for evaluation of tremors, anxiety, and follow up on CVA. He is doing better he states.  He has spastic hemiplegia and is seeing pain management for pain. He has recently see GI and his dysphagia is a lot better and can swallow better.  He states he has Gained 14 pounds since getting out of the hospital after his CVA.  He is still having problems with Tremors and states the clonazepam is helping.  He needs refill on clonazepam. He was bitten by a tick and wants to be checked for RMSF and lymes disease.  Review of Systems C/o CVA, left hemiparesis and spastic hemiplegia. No chest pain, SOB, HA, dizziness, vision change, N/V, diarrhea, constipation, dysuria, urinary urgency or frequency, myalgias, arthralgias or rash.     Objective:   Physical Exam Vital signs noted  Chronically ill appearing male in NAD.  HEENT - Head atraumatic Normocephalic                Eyes - PERRLA, Conjuctiva - clear Sclera- Clear EOMI                Ears - EAC's Wnl TM's Wnl Gross Hearing WNL                Nose - Nares patent                 Throat - oropharanx wnl Respiratory - Lungs CTA bilateral Cardiac - RRR S1 and S2 without murmur GI - Abdomen soft Nontender and bowel sounds active x 4 Extremities - No edema. Neuro - Left hemiplegia.       Assessment & Plan:  Tick bite - Plan: Rocky mtn spotted fvr abs pnl(IgG+IgM), Lyme Ab/Western Blot Reflex  Tremors of nervous system - Plan: clonazePAM (KLONOPIN) 0.5 MG tablet  Spastic hemiplegia - Follow up with pain management.  Dysphasia - Recently has seen GI and no recommendations for any swallowing study and he no longer is Having dysphasia and is eating fine and is gaining weight.  Chronic Pain - Follow up with pain mangement.

## 2013-05-11 NOTE — Patient Instructions (Signed)
Stroke Prevention Some medical conditions and behaviors are associated with an increased chance of having a stroke. You may prevent a stroke by making healthy choices and managing medical conditions. Reduce your risk of having a stroke by:  Staying physically active. Get at least 30 minutes of activity on most or all days.  Not smoking. It may also be helpful to avoid exposure to secondhand smoke.  Limiting alcohol use. Moderate alcohol use is considered to be:  No more than 2 drinks per day for men.  No more than 1 drink per day for nonpregnant women.  Eating healthy foods.  Include 5 or more servings of fruits and vegetables a day.  Certain diets may be prescribed to address high blood pressure, high cholesterol, diabetes, or obesity.  Managing your cholesterol levels.  A low-saturated fat, low-trans fat, low-cholesterol, and high-fiber diet may control cholesterol levels.  Take any prescribed medicines to control cholesterol as directed by your caregiver.  Managing your diabetes.  A controlled-carbohydrate, controlled-sugar diet is recommended to manage diabetes.  Take any prescribed medicines to control diabetes as directed by your caregiver.  Controlling your high blood pressure (hypertension).  A low-salt (sodium), low-saturated fat, low-trans fat, and low-cholesterol diet is recommended to manage high blood pressure.  Take any prescribed medicines to control hypertension as directed by your caregiver.  Maintaining a healthy weight.  A reduced-calorie, low-sodium, low-saturated fat, low-trans fat, low-cholesterol diet is recommended to manage weight.  Stopping drug abuse.  Avoiding birth control pills.  Talk to your caregiver about the risks of taking birth control pills if you are over 35 years old, smoke, get migraines, or have ever had a blood clot.  Getting evaluated for sleep disorders (sleep apnea).  Talk to your caregiver about getting a sleep evaluation  if you snore a lot or have excessive sleepiness.  Taking medicines as directed by your caregiver.  For some people, aspirin or blood thinners (anticoagulants) are helpful in reducing the risk of forming abnormal blood clots that can lead to stroke. If you have the irregular heart rhythm of atrial fibrillation, you should be on a blood thinner unless there is a good reason you cannot take them.  Understand all your medicine instructions. SEEK IMMEDIATE MEDICAL CARE IF:   You have sudden weakness or numbness of the face, arm, or leg, especially on one side of the body.  You have sudden confusion.  You have trouble speaking (aphasia) or understanding.  You have sudden trouble seeing in one or both eyes.  You have sudden trouble walking.  You have dizziness.  You have a loss of balance or coordination.  You have a sudden, severe headache with no known cause.  You have new chest pain or an irregular heartbeat. Any of these symptoms may represent a serious problem that is an emergency. Do not wait to see if the symptoms will go away. Get medical help right away. Call your local emergency services (911 in [place] Do not drive yourself to the hospital. Document Released: 10/09/2004 Document Revised: 11/24/2011 Document Reviewed: 04/21/2011 Exit[employer] Patient Information 2014 [employer].  

## 2013-05-13 LAB — LYME AB/WESTERN BLOT REFLEX
LYME DISEASE AB, QUANT, IGM: <0.8 {index} (ref 0.00–0.79)
Lyme IgG/IgM Ab: <0.91 {ISR} (ref 0.00–0.90)

## 2013-05-13 LAB — ROCKY MTN SPOTTED FVR ABS PNL(IGG+IGM)
RMSF IgG: NEGATIVE
RMSF IgM: 0.29 {index} (ref 0.00–0.89)

## 2013-05-25 ENCOUNTER — Ambulatory Visit: Payer: Medicare Other | Admitting: Family Medicine

## 2013-06-23 ENCOUNTER — Other Ambulatory Visit: Payer: Self-pay

## 2013-06-23 MED ORDER — AMLODIPINE BESYLATE 10 MG PO TABS: 10.0000 mg | ORAL_TABLET | Freq: Every day | ORAL | Status: DC

## 2013-07-05 ENCOUNTER — Ambulatory Visit (INDEPENDENT_AMBULATORY_CARE_PROVIDER_SITE_OTHER): Payer: Medicare Other | Admitting: Family Medicine

## 2013-07-05 ENCOUNTER — Ambulatory Visit (INDEPENDENT_AMBULATORY_CARE_PROVIDER_SITE_OTHER): Payer: Medicare Other

## 2013-07-05 ENCOUNTER — Encounter: Payer: Self-pay | Admitting: Family Medicine

## 2013-07-05 VITALS — BP 123/72 | HR 62 | Temp 98.3°F | Ht 71.0 in | Wt 151.8 lb

## 2013-07-05 DIAGNOSIS — R300 Dysuria: Secondary | ICD-10-CM

## 2013-07-05 DIAGNOSIS — R05 Cough: Secondary | ICD-10-CM

## 2013-07-05 DIAGNOSIS — R531 Weakness: Secondary | ICD-10-CM

## 2013-07-05 DIAGNOSIS — R599 Enlarged lymph nodes, unspecified: Secondary | ICD-10-CM

## 2013-07-05 DIAGNOSIS — R591 Generalized enlarged lymph nodes: Secondary | ICD-10-CM

## 2013-07-05 DIAGNOSIS — R5381 Other malaise: Secondary | ICD-10-CM

## 2013-07-05 LAB — POCT URINALYSIS DIPSTICK
Bilirubin, UA: NEGATIVE
Glucose, UA: NEGATIVE
Ketones, UA: NEGATIVE
Leukocytes, UA: NEGATIVE
Nitrite, UA: NEGATIVE
Spec Grav, UA: 1.01
Urobilinogen, UA: NEGATIVE
pH, UA: 6

## 2013-07-05 LAB — POCT UA - MICROSCOPIC ONLY
Bacteria, U Microscopic: NEGATIVE
Casts, Ur, LPF, POC: NEGATIVE
Crystals, Ur, HPF, POC: NEGATIVE
Epithelial cells, urine per micros: NEGATIVE
RBC, urine, microscopic: NEGATIVE
WBC, Ur, HPF, POC: NEGATIVE
Yeast, UA: NEGATIVE

## 2013-07-05 MED ORDER — CIPROFLOXACIN HCL 500 MG PO TABS: 500.0000 mg | ORAL_TABLET | Freq: Two times a day (BID) | ORAL | Status: DC

## 2013-07-05 NOTE — Progress Notes (Signed)
Subjective:    Patient ID: Carlos Grimes, male    DOB: 06/03/1946, 67 y.o.   MRN: [id number]  HPI  This 67 y.o. male presents for evaluation of swelling of the left neck.  He  Has not been feeling well over the last few weeks, lost his appetite and he has  Had some nausea and thought he had a viral syndrome.  He is no longer nauseated. He has questions about medicine he took a year ago.  He states he was taken off ambien and rx'd amitriptyline and cymbalta and this caused adverse side effects and then He was hospitalized for 4 days.  He c/o dysuria and pain in his penis when he voids. He states he has been taking clonazepam tid for anxiety.  He has questions about the Clonazepam and states he was rx'd #90 and he needs more but then states he is only taking It one po tid and it is ordered one to two po tid prn.  He states he knows all about medicine and  In the past he had some sort of drug problem and he knows all about how to prescribe.  He  Has been on ambien in the past and this was dc'd evidently by another provider and he states " I am Going to sue him because he messed me up."  "Ambien is good medicine."  He is seeing pain management For his chronic pain.  He is taking tramadol rx'd by his pain management doctor.  He States he does not smoke cigarettes.  He c/o weakness.  He is concerned he has cancer and states he Wants to be checked for cancer.  He states he doesn't believe the doctors know what is wrong with him and Then he asks "Do you know what your doing?"  He then asks me if I am all right.  He has multiple  Concerns and complaints and does ramble on becoming anxious when asked to explain what he means By "do I know what I am doing" or "I had a drug problem" or "I am going to sue him for messing me up." He hands me a piece of paper that is barely legible and explains that he spoke to a pharmacist and they Wrote down what can happen when you take amitriptyline and  cymbalta.  Review of Systems C/o cough, dysuria, weakness, and anxiety No chest pain, SOB, HA, dizziness, vision change, N/V, diarrhea, constipation,  myalgias, arthralgias or rash.     Objective:   Physical Exam  Vital signs noted  Well developed well nourished anxious male.  HEENT - Head atraumatic Normocephalic                Eyes - PERRLA, Conjuctiva - clear Sclera- Clear EOMI                Ears - EAC's Wnl TM's Wnl Gross Hearing WNL                Nose - Nares patent                 Throat - oropharanx wnl Respiratory - Lungs CTA diminished throughout Cardiac - RRR s2 and s2 w/o murmur GI - Abdomen soft Nontender and bowel sounds active x 4 Extremities - No edema. Neuro - Grossly intact. Psychiatric - Patient is anxious. He is somewhat scattered today with his thoughts and has flight of ideas.  CXR - Wnl no infiltrates    Assessment & Plan:  Lymphadenopathy - Plan: DG Chest 2 View, POCT CBC, CMP14+EGFR, Sedimentation rate, tx with cipro 500mg  one po bid x 2 weeks.  Dysuria - Plan: CMP14+EGFR, POCT urinalysis dipstick, POCT UA - Microscopic Only, PSA, total and free, Sedimentation rate.  Cipro 500mg  one po bid x 2 weeks #28  Cough - Plan: CMP14+EGFR, ciprofloxacin (CIPRO) 500 MG tablet, Sedimentation rate  Anxiety - Discussed he use the clonazepam tid prn and try to cut back as he may be taking too much.  I have asked him to follow up in a week. Will call his pain management office and discuss if they want to take over rx his clonazepam since he has had this rx'd by them in the past. He does not have any other prescribers seen on his CSR but myself and his pain doctor.  Concerned he may be taking too much narcotic  Medicine and may need this tapered back.  Will check labs tx him empirically for his urinary concerns and get him back in a week.  Deatra Canter FNP

## 2013-07-05 NOTE — Patient Instructions (Signed)
Lymphadenopathy °Lymphadenopathy means "disease of the lymph glands." But the term is usually used to describe swollen or enlarged lymph glands, also called lymph nodes. These are the bean-shaped organs found in many locations including the neck, underarm, and groin. Lymph glands are part of the immune system, which fights infections in your body. Lymphadenopathy can occur in just one area of the body, such as the neck, or it can be generalized, with lymph node enlargement in several areas. The nodes found in the neck are the most common sites of lymphadenopathy. °CAUSES  °When your immune system responds to germs (such as viruses or bacteria ), infection-fighting cells and fluid build up. This causes the glands to grow in size. This is usually not something to worry about. Sometimes, the glands themselves can become infected and inflamed. This is called lymphadenitis. °Enlarged lymph nodes can be caused by many diseases: °· Bacterial disease, such as strep throat or a skin infection. °· Viral disease, such as a common cold. °· Other germs, such as lyme disease, tuberculosis, or sexually transmitted diseases. °· Cancers, such as lymphoma (cancer of the lymphatic system) or leukemia (cancer of the white blood cells). °· Inflammatory diseases such as lupus or rheumatoid arthritis. °· Reactions to medications. °Many of the diseases above are rare, but important. This is why you should see your caregiver if you have lymphadenopathy. °SYMPTOMS  °· Swollen, enlarged lumps in the neck, back of the head or other locations. °· Tenderness. °· Warmth or redness of the skin over the lymph nodes. °· Fever. °DIAGNOSIS  °Enlarged lymph nodes are often near the source of infection. They can help healthcare providers diagnose your illness. For instance:  °· Swollen lymph nodes around the jaw might be caused by an infection in the mouth. °· Enlarged glands in the neck often signal a throat infection. °· Lymph nodes that are swollen  in more than one area often indicate an illness caused by a virus. °Your caregiver most likely will know what is causing your lymphadenopathy after listening to your history and examining you. Blood tests, x-rays or other tests may be needed. If the cause of the enlarged lymph node cannot be found, and it does not go away by itself, then a biopsy may be needed. Your caregiver will discuss this with you. °TREATMENT  °Treatment for your enlarged lymph nodes will depend on the cause. Many times the nodes will shrink to normal size by themselves, with no treatment. Antibiotics or other medicines may be needed for infection. Only take over-the-counter or prescription medicines for pain, discomfort or fever as directed by your caregiver. °HOME CARE INSTRUCTIONS  °Swollen lymph glands usually return to normal when the underlying medical condition goes away. If they persist, contact your health-care provider. He/she might prescribe antibiotics or other treatments, depending on the diagnosis. Take any medications exactly as prescribed. Keep any follow-up appointments made to check on the condition of your enlarged nodes.  °SEEK MEDICAL CARE IF:  °· Swelling lasts for more than two weeks. °· You have symptoms such as weight loss, night sweats, fatigue or fever that does not go away. °· The lymph nodes are hard, seem fixed to the skin or are growing rapidly. °· Skin over the lymph nodes is red and inflamed. This could mean there is an infection. °SEEK IMMEDIATE MEDICAL CARE IF:  °· Fluid starts leaking from the area of the enlarged lymph node. °· You develop a fever of 102° F (38.9° C) or greater. °· Severe   pain develops (not necessarily at the site of a large lymph node). °· You develop chest pain or shortness of breath. °· You develop worsening abdominal pain. °MAKE SURE YOU:  °· Understand these instructions. °· Will watch your condition. °· Will get help right away if you are not doing well or get worse. °Document  Released: 06/10/2008 Document Revised: 11/24/2011 Document Reviewed: 06/10/2008 °[employer]® Patient Information ©2014 [employer]. ° °

## 2013-07-07 LAB — CMP14+EGFR
ALT: 14 IU/L (ref 0–44)
AST: 17 IU/L (ref 0–40)
Albumin/Globulin Ratio: 1.8 (ref 1.1–2.5)
Albumin: 4.9 g/dL — ABNORMAL HIGH (ref 3.6–4.8)
Alkaline Phosphatase: 92 IU/L (ref 39–117)
BUN/Creatinine Ratio: 11 (ref 10–22)
BUN: 10 mg/dL (ref 8–27)
CO2: 26 mmol/L (ref 18–29)
Calcium: 10.2 mg/dL (ref 8.6–10.2)
Chloride: 98 mmol/L (ref 97–108)
Creatinine, Ser: 0.94 mg/dL (ref 0.76–1.27)
GFR calc Af Amer: 97 mL/min/{1.73_m2} (ref >59)
GFR calc non Af Amer: 84 mL/min/{1.73_m2} (ref >59)
Globulin, Total: 2.7 g/dL (ref 1.5–4.5)
Glucose: 101 mg/dL — ABNORMAL HIGH (ref 65–99)
Potassium: 4 mmol/L (ref 3.5–5.2)
Sodium: 142 mmol/L (ref 134–144)
Total Bilirubin: 0.9 mg/dL (ref 0.0–1.2)
Total Protein: 7.6 g/dL (ref 6.0–8.5)

## 2013-07-07 LAB — PSA, TOTAL AND FREE
PSA, Free Pct: 16.8 %
PSA, Free: 0.32 ng/mL
PSA: 1.9 ng/mL (ref 0.0–4.0)

## 2013-07-07 LAB — SEDIMENTATION RATE: Sed Rate: 8 mm/hr (ref 0–30)

## 2013-07-14 ENCOUNTER — Telehealth: Payer: Self-pay | Admitting: Family Medicine

## 2013-07-18 NOTE — Telephone Encounter (Signed)
PT NOTIFIED AND FEELS BETTER.

## 2013-07-18 NOTE — Telephone Encounter (Signed)
PT NOTIFIED  

## 2013-07-19 ENCOUNTER — Encounter: Payer: Self-pay | Admitting: Family Medicine

## 2013-07-19 ENCOUNTER — Ambulatory Visit (INDEPENDENT_AMBULATORY_CARE_PROVIDER_SITE_OTHER): Payer: Medicare Other | Admitting: Family Medicine

## 2013-07-19 VITALS — BP 120/74 | HR 54 | Temp 98.9°F | Ht 71.0 in | Wt 153.4 lb

## 2013-07-19 DIAGNOSIS — F411 Generalized anxiety disorder: Secondary | ICD-10-CM

## 2013-07-19 DIAGNOSIS — R350 Frequency of micturition: Secondary | ICD-10-CM

## 2013-07-19 DIAGNOSIS — G894 Chronic pain syndrome: Secondary | ICD-10-CM

## 2013-07-19 DIAGNOSIS — K5900 Constipation, unspecified: Secondary | ICD-10-CM

## 2013-07-19 MED ORDER — TAMSULOSIN HCL 0.4 MG PO CAPS: 0.4000 mg | ORAL_CAPSULE | Freq: Every day | ORAL | Status: DC

## 2013-07-19 NOTE — Progress Notes (Signed)
  Subjective:    Patient ID: Carlos Grimes, male    DOB: 1946/04/27, 67 y.o.   MRN: [id number]  HPI This 67 y.o. male presents for evaluation of anxiety.  He has been  Having depression and anxiety and is here for follow up.  He states He ran out of his clonazepam.  He states he would like to go up on the Clonazepam.  He is here for follow up on labs and nocturia and URI. He is better in regards to his URI after taking the cipro for 2 weeks. He states he is still having nocturia and urinary frequency.  He  Is seeing pain management and one time he was being rx'd clonazepam 0.5mg  #60 a month.  He does currently have refills on clonazepam.   He has problems with constipation.  He has hx of CVA.   Review of Systems C/o anxiety, urinary frequency, and constipation. No chest pain, SOB, HA, dizziness, vision change, N/V, diarrhea, constipation, dysuria, urinary urgency or frequency, myalgias, arthralgias or rash.     Objective:   Physical Exam Vital signs noted  Well developed well nourished male.  HEENT - Head atraumatic Normocephalic                Eyes - PERRLA, Conjuctiva - clear Sclera- Clear EOMI                Ears - EAC's Wnl TM's Wnl Gross Hearing WNL                Nose - Nares patent                 Throat - oropharanx wnl Respiratory - Lungs CTA bilateral Cardiac - RRR S1 and S2 without murmur GI - Abdomen soft Nontender and bowel sounds active x 4 Extremities - No edema. Neuro - Right hemiplegia       Assessment & Plan:  Urinary frequency - Plan: tamsulosin (FLOMAX) 0.4 MG CAPS capsule. Discussed labs and xray which were normal from last viist.  Unspecified constipation - Continue otc miralax  Anxiety state, unspecified - Recommend follow up with pain management for anxiety medicine and  Do not recommend escalating.    Chronic pain syndrome - Continue to follow up with pain management and have them take over Anxiety medication.  Paitent agrees to have pain  management rx him his clonazepam from here On out and will not rx anymore clonazepam.  Deatra Canter FNP

## 2013-07-19 NOTE — Patient Instructions (Signed)
Urinary Frequency °The number of times a normal person urinates depends upon how much liquid they take in and how much liquid they are losing. If the temperature is hot and there is high humidity then the person will sweat more and usually breathe a little more frequently. These factors decrease the amount of frequency of urination that would be considered normal. °The amount you drink is easily determined, but the amount of fluid lost is sometimes more difficult to calculate.  °Fluid is lost in two ways: °· Sensible fluid loss is usually measured by the amount of urine that you get rid of. Losses of fluid can also occur with diarrhea. °· Insensible fluid loss is more difficult to measure. It is caused by evaporation. Insensible loss of fluid occurs through breathing and sweating. It usually ranges from a little less than a quart to a little more than a quart of fluid a day. °In normal temperatures and activity levels the average person may urinate 4 to 7 times in a 24-hour period. Needing to urinate more often than that could indicate a problem. If one urinates 4 to 7 times in 24 hours and has large volumes each time, that could indicate a different problem from one who urinates 4 to 7 times a day and has small volumes. The time of urinating is also an important. Most urinating should be done during the waking hours. Getting up at night to urinate frequently can indicate some problems. °CAUSES  °The bladder is the organ in your lower abdomen that holds urine. Like a balloon, it swells some as it fills up. Your nerves sense this and tell you it is time to head for the bathroom. There are a number of reasons that you might feel the need to urinate more often than usual. They include: °· Urinary tract infection. This is usually associated with other signs such as burning when you urinate. °· In men, problems with the prostate (a walnut-size gland that is located near the tube that carries urine out of your body).  There are two reasons why the prostate can cause an increased frequency of urination: °· An enlarged prostate that does not let the bladder empty well. If the bladder only half empties when you urinate then it only has half the capacity to fill before you have to urinate again. °· The nerves in the bladder become more hypersensitive with an increased size of the prostate even if the bladder empties completely. °· Pregnancy. °· Obesity. Excess weight is more likely to cause a problem for women more than for men. °· Bladder stones or other bladder problems. °· Caffeine. °· Alcohol. °· Medications. For example, drugs that help the body get rid of extra fluid (diuretics) increase urine production. Some other medicines must be taken with lots of fluids. °· Muscle or nerve weakness. This might be the result of a spinal cord injury, a stroke, multiple sclerosis or Parkinson's disease. °· Long-standing diabetes can decrease the sensation of the bladder. This loss of sensation makes it harder to sense the bladder needs to be emptied. Over a period of years the bladder is stretched out by constant overfilling. This weakens the bladder muscles so that the bladder does not empty well and has less capacity to fill with new urine. °· Interstitial cystitis (also called painful bladder syndrome). This condition develops because the tissues that line the insider of the bladder are inflamed (inflammation is the body's way of reacting to injury or infection). It causes pain   and frequent urination. It occurs in women more often than in men. °DIAGNOSIS  °· To decide what might be causing your urinary frequency, your healthcare provider will probably: °· Ask about symptoms you have noticed. °· Ask about your overall health. This will include questions about any medications you are taking. °· Do a physical examination. °· Order some tests. These might include: °· A blood test to check for diabetes or other health issues that could be  contributing to the problem. °· Urine testing. This could measure the flow of urine and the pressure on the bladder. °· A test of your neurological system (the brain, spinal cord and nerves). This is the system that senses the need to urinate. °· A bladder test to check whether it is emptying completely when you urinate. °· Cytoscopy. This test uses a thin tube with a tiny camera on it. It offers a look inside your urethra and bladder to see if there are problems. °· Imaging tests. You might be given a contrast dye and then asked to urinate. X-rays are taken to see how your bladder is working. °TREATMENT  °It is important for you to be evaluated to determine if the amount or frequency that you have is unusual or abnormal. If it is found to be abnormal the cause should be determined and this can usually be found out easily. Depending upon the cause treatment could include medication, stimulation of the nerves, or surgery. °There are not too many things that you can do as an individual to change your urinary frequency. It is important that you balance the amount of fluid intake needed to compensate for your activity and the temperature. Medical problems will be diagnosed and taken care of by your physician. There is no particular bladder training such as Kegel's exercises that you can do to help urinary frequency. This is an exercise this is usually done for people who have leaking of urine when they laugh cough or sneeze. °HOME CARE INSTRUCTIONS  °· Take any medications your healthcare provider prescribed or suggested. Follow the directions carefully. °· Practice any lifestyle changes that are recommended. These might include: °· Drinking less fluid or drinking at different times of the day. If you need to urinate often during the night, for example, you may need to stop drinking fluids early in the evening. °· Cutting down on caffeine or alcohol. They both can make you need to urinate more often than normal.  Caffeine is found in coffee, tea and sodas. °· Losing weight, if that is recommended. °· Keep a journal or a log. You might be asked to record how much you drink and when and when you feel the need to urinate. This will also help evaluate how well the treatment provided by your physician is working. °SEEK MEDICAL CARE IF:  °· Your need to urinate often gets worse. °· You feel increased pain or irritation when you urinate. °· You notice blood in your urine. °· You have questions about any medications that your healthcare provider recommended. °· You notice blood, pus or swelling at the site of any test or treatment procedure. °· You develop a fever of more than 100.5° F (38.1° C). °SEEK IMMEDIATE MEDICAL CARE IF:  °You develop a fever of more than 102.0° F (38.9° C). °Document Released: 06/28/2009 Document Revised: 11/24/2011 Document Reviewed: 06/28/2009 °[employer]® Patient Information ©2014 [employer]. ° °

## 2013-09-13 ENCOUNTER — Telehealth: Payer: Self-pay | Admitting: Family Medicine

## 2013-09-14 ENCOUNTER — Other Ambulatory Visit: Payer: Self-pay | Admitting: Family Medicine

## 2013-09-14 DIAGNOSIS — H9193 Unspecified hearing loss, bilateral: Secondary | ICD-10-CM

## 2013-09-21 ENCOUNTER — Encounter: Payer: Self-pay | Admitting: Family Medicine

## 2013-09-21 ENCOUNTER — Ambulatory Visit (INDEPENDENT_AMBULATORY_CARE_PROVIDER_SITE_OTHER): Payer: Medicare Other | Admitting: Family Medicine

## 2013-09-21 VITALS — BP 139/69 | HR 70 | Temp 97.1°F | Ht 71.0 in | Wt 162.2 lb

## 2013-09-21 DIAGNOSIS — I639 Cerebral infarction, unspecified: Secondary | ICD-10-CM

## 2013-09-21 DIAGNOSIS — R252 Cramp and spasm: Secondary | ICD-10-CM

## 2013-09-21 DIAGNOSIS — I6350 Cerebral infarction due to unspecified occlusion or stenosis of unspecified cerebral artery: Secondary | ICD-10-CM

## 2013-09-21 DIAGNOSIS — G4700 Insomnia, unspecified: Secondary | ICD-10-CM

## 2013-09-21 NOTE — Patient Instructions (Signed)
Ischemic Stroke A stroke (cerebrovascular accident) is the sudden death of brain tissue. It is a medical emergency. A stroke can cause permanent loss of brain function. This can cause problems with different parts of your body. A transient ischemic attack (TIA) is different because it does not cause permanent damage. A TIA is a short-lived problem of poor blood flow affecting a part of the brain. A TIA is also a serious problem because having a TIA greatly increases the chances of having a stroke. When symptoms first develop, you cannot know if the problem might be a stroke or TIA. CAUSES  A stroke is caused by a decrease of oxygen supply to an area of your brain. It is usually the result of a small blood clot or collection of cholesterol or fat (plaque) that blocks blood flow in the brain. A stroke can also be caused by blocked or damaged carotid arteries.  RISK FACTORS  High blood pressure (hypertension).  High cholesterol.  Diabetes mellitus.  Heart disease.  The build up of plaque in the blood vessels (peripheral artery disease or atherosclerosis).  The build up of plaque in the blood vessels providing blood and oxygen to the brain (carotid artery stenosis).  An abnormal heart rhythm (atrial fibrillation).  Obesity.  Smoking.  Taking oral contraceptives (especially in combination with smoking).  Physical inactivity.  A diet high in fats, salt (sodium), and calories.  Alcohol use.  Use of illegal drugs (especially cocaine and methamphetamine).  Being African American.  Being over the age of 55.  Family history of stroke.  Previous history of blood clots, stroke, TIA, or heart attack.  Sickle cell disease. SYMPTOMS  These symptoms usually develop suddenly, or may be newly present upon awakening from sleep:  Sudden weakness or numbness of the face, arm, or leg, especially on one side of the body.  Sudden trouble walking or difficulty moving arms or legs.  Sudden  confusion.  Sudden personality changes.  Trouble speaking (aphasia) or understanding.  Difficulty swallowing.  Sudden trouble seeing in one or both eyes.  Double vision.  Dizziness.  Loss of balance or coordination.  Sudden severe headache with no known cause.  Trouble reading or writing. DIAGNOSIS  Your caregiver can often determine the presence or absence of a stroke based on your symptoms, history, and physical exam. Computed tomography (CT) of the brain is usually performed to confirm the stroke, determine causes, and determine stroke severity. Other tests may be done to find the cause of the stroke. These tests may include:  Electrocardiography.  Continuous heart monitoring.  Echocardiography.  Carotid ultrasonography.  Magnetic resonance imaging (MRI).  A scan of the brain circulation.  Blood tests. PREVENTION  The risk of a stroke can be decreased by appropriately treating high blood pressure, high cholesterol, diabetes, heart disease, and obesity and by quitting smoking, limiting alcohol, and staying physically active. TREATMENT  Time is of the essence. It is important to seek treatment within 3 4 hours of the start of symptoms because you may receive a medicine to dissolve the clot (thrombolytic) that cannot be given after that time. Even if you do not know when your symptoms began, get treatment as soon as possible. After the 4 hour window has passed, treatment may include rest, oxygen, intravenous (IV) fluids, and medicines to thin the blood (anticoagulants). Treatment of stroke depends on the duration, severity, and cause of your symptoms. Medicines and diet may be used to address diabetes, high blood pressure, and other risk   factors. Physical, speech, and occupational therapists will assess you and work to improve any functions impaired by the stroke. Measures will be taken to prevent short-term and long-term complications, including infection from breathing  foreign material into the lungs (aspiration pneumonia), blood clots in the legs, bedsores, and falls. Rarely, surgery may be needed to remove large blood clots or to open up blocked arteries. HOME CARE INSTRUCTIONS   Take all medicines prescribed by your caregiver. Follow the directions carefully. Medicines may be used to control risk factors for a stroke. Be sure you understand all your medicine instructions.  You may be told to take aspirin or the anticoagulant warfarin. Warfarin needs to be taken exactly as instructed.  Too much and too little warfarin are both dangerous. Too much warfarin increases the risk of bleeding. Too little warfarin continues to allow the risk for blood clots. While taking warfarin, you will need to have regular blood tests to measure your blood clotting time. These blood tests usually include both the PT and INR tests. The PT and INR results allow your caregiver to adjust your dose of warfarin. The dose can change for many reasons. It is critically important that you take warfarin exactly as prescribed, and that you have your PT and INR levels drawn exactly as directed.  Many foods, especially foods high in vitamin K can interfere with warfarin and affect the PT and INR results. Foods high in vitamin K include spinach, kale, broccoli, cabbage, collard and turnip greens, brussels sprouts, peas, cauliflower, seaweed, and parsley as well as beef and pork liver, green tea, and soybean oil. You should eat a consistent amount of foods high in vitamin K. Avoid major changes in your diet, or notify your caregiver before changing your diet. Arrange a visit with a dietitian to answer your questions.  Many medicines can interfere with warfarin and affect the PT and INR results. You must tell your caregiver about any and all medicines you take, this includes all vitamins and supplements. Be especially cautious with aspirin and anti-inflammatory medicines. Do not take or discontinue any  prescribed or over-the-counter medicine except on the advice of your caregiver or pharmacist.  Warfarin can have side effects, such as excessive bruising or bleeding. You will need to hold pressure over cuts for longer than usual. Your caregiver or pharmacist will discuss other potential side effects.  Avoid sports or activities that may cause injury or bleeding.  Be mindful when shaving, flossing your teeth, or handling sharp objects.  Alcohol can change the body's ability to handle warfarin. It is best to avoid alcoholic drinks or consume only very small amounts while taking warfarin. Notify your caregiver if you change your alcohol intake.  Notify your dentist or other caregivers before procedures.  If swallow studies have determined that your swallowing reflex is present, you should eat healthy foods. A diet that includes 5 or more servings of fruits and vegetables a day may reduce the risk of stroke. Foods may need to be a special consistency (soft or pureed), or small bites may need to be taken in order to avoid aspirating or choking. Certain diets may be prescribed to address high blood pressure, high cholesterol, diabetes, or obesity.  A low-sodium, low-saturated fat, low-trans fat, low-cholesterol diet is recommended to manage high blood pressure.  A low-saturated fat, low-trans fat, low-cholesterol, and high-fiber diet may control cholesterol levels.  A controlled-carbohydrate, controlled-sugar diet is recommended to manage diabetes.  A reduced-calorie, low-sodium, low-saturated fat, low-trans fat, low-cholesterol diet   is recommended to manage obesity.  Maintain a healthy weight.  Stay physically active. It is recommended that you get at least 30 minutes of activity on most or all days.  Do not smoke.  Limit alcohol use even if you are not taking warfarin. Moderate alcohol use is considered to be:  No more than 2 drinks each day for men.  No more than 1 drink each day for  nonpregnant women.  Stop drug abuse.  Home safety. A safe home environment is important to reduce the risk of falls. Your caregiver may arrange for specialists to evaluate your home. Having grab bars in the bedroom and bathroom is often important. Your caregiver may arrange for equipment to be used at home, such as raised toilets and a seat for the shower.  Physical, occupational, and speech therapy. Ongoing therapy may be needed to maximize your recovery after a stroke. If you have been advised to use a walker or a cane, use it at all times. Be sure to keep your therapy appointments.  Follow all instructions for follow-up with your caregiver. This is very important. This includes any referrals, physical therapy, rehabilitation, and lab tests. Proper follow up can prevent another stroke from occurring. SEEK MEDICAL CARE IF:  You have personality changes.  You have difficulty swallowing.  You are seeing double.  You have dizziness.  You have a fever.  You have skin breakdown. SEEK IMMEDIATE MEDICAL CARE IF:  Any of these symptoms may represent a serious problem that is an emergency. Do not wait to see if the symptoms will go away. Get medical help right away. Call your local emergency services (911 in [place] Do not drive yourself to the hospital.  You have sudden weakness or numbness of the face, arm, or leg, especially on one side of the body.  You have sudden trouble walking or difficulty moving arms or legs.  You have sudden confusion.  You have trouble speaking (aphasia) or understanding.  You have sudden trouble seeing in one or both eyes.  You have a loss of balance or coordination.  You have a sudden, severe headache with no known cause.  You have new chest pain or an irregular heartbeat.  You have a partial or total loss of consciousness.   Document Released: 09/01/2005 Document Revised: 05/04/2013 Document Reviewed: 04/11/2012 [employer] Patient Information 2014  [employer]  

## 2013-09-21 NOTE — Progress Notes (Signed)
   Subjective:    Patient ID: Carlos Grimes, male    DOB: 01/15/1946, 68 y.o.   MRN: [id number]  HPI This 68 y.o. male presents for evaluation of wellness visit.  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  He has hx of right spastic hemiplegia.  He states he is planning on going To his pain doctor to get his clonazepam because I will not rx it.  He c/o insomnia.  He c/o cramps  In his lower extremities.  He states he is going to now get his clonazepam from his stroke doctor since I cannot prescribe or as he states " Your rules will not let your prescribe medicine certain medicine." He states he liked it when he was seeing a doctor who could prescribe every kind of medicine.    Review of Systems C/o insomnia and leg cramps No chest pain, SOB, HA, dizziness, vision change, N/V, diarrhea, constipation, dysuria, urinary urgency or frequency, myalgias, arthralgias or rash.     Objective:   Physical Exam Vital signs noted  Thin chronically ill appearing male.  HEENT - Head atraumatic Normocephalic                Eyes - PERRLA, Conjuctiva - clear Sclera- Clear EOMI                Ears - EAC's Wnl TM's Wnl Gross Hearing WNL Respiratory - Lungs CTA bilateral Cardiac - RRR S1 and S2 without murmur GI - Abdomen soft Nontender and bowel sounds active x 4 Extremities - No edema. Neuro - Grossly intact.       Assessment & Plan:  CVA (cerebral infarction) - Follow up with pain management doctor and stroke doctor.  Insomnia - He was rx'd ambien in the past by his pain doctor and this was dc'd and then he was rx'd amitriptyline and this was dc'd.  He is currently on clonazepam and this is now being rx'd to him By his dentist according to the CSR and he acknowledges this.  I recommend he follow up with his Pain management doctor for his insomnia medicine.  He states "Fuck it I will."  I did also mention that When I saw him in October of 2014 he did get a physical and  physical labs.  Cramps, extremity - He is on clonazepam which helps this and would defer to his pain management and stroke doctor.  Lysbeth Penner FNP

## 2013-10-28 ENCOUNTER — Encounter: Payer: Self-pay | Admitting: Physical Medicine & Rehabilitation

## 2013-10-28 ENCOUNTER — Telehealth: Payer: Self-pay

## 2013-10-28 ENCOUNTER — Encounter (INDEPENDENT_AMBULATORY_CARE_PROVIDER_SITE_OTHER): Payer: Self-pay

## 2013-10-28 ENCOUNTER — Ambulatory Visit (HOSPITAL_BASED_OUTPATIENT_CLINIC_OR_DEPARTMENT_OTHER): Payer: Medicare Other | Admitting: Physical Medicine & Rehabilitation

## 2013-10-28 ENCOUNTER — Encounter: Payer: Medicare Other | Attending: Physical Medicine & Rehabilitation

## 2013-10-28 VITALS — BP 146/69 | HR 67 | Resp 14 | Ht 71.0 in | Wt 162.2 lb

## 2013-10-28 DIAGNOSIS — F3289 Other specified depressive episodes: Secondary | ICD-10-CM | POA: Insufficient documentation

## 2013-10-28 DIAGNOSIS — K219 Gastro-esophageal reflux disease without esophagitis: Secondary | ICD-10-CM | POA: Insufficient documentation

## 2013-10-28 DIAGNOSIS — F411 Generalized anxiety disorder: Secondary | ICD-10-CM | POA: Insufficient documentation

## 2013-10-28 DIAGNOSIS — E785 Hyperlipidemia, unspecified: Secondary | ICD-10-CM | POA: Insufficient documentation

## 2013-10-28 DIAGNOSIS — I10 Essential (primary) hypertension: Secondary | ICD-10-CM | POA: Insufficient documentation

## 2013-10-28 DIAGNOSIS — R259 Unspecified abnormal involuntary movements: Secondary | ICD-10-CM

## 2013-10-28 DIAGNOSIS — Z860100 Personal history of colon polyps, unspecified: Secondary | ICD-10-CM | POA: Insufficient documentation

## 2013-10-28 DIAGNOSIS — F329 Major depressive disorder, single episode, unspecified: Secondary | ICD-10-CM | POA: Insufficient documentation

## 2013-10-28 DIAGNOSIS — G890 Central pain syndrome: Secondary | ICD-10-CM

## 2013-10-28 DIAGNOSIS — I69959 Hemiplegia and hemiparesis following unspecified cerebrovascular disease affecting unspecified side: Secondary | ICD-10-CM | POA: Insufficient documentation

## 2013-10-28 DIAGNOSIS — G8110 Spastic hemiplegia affecting unspecified side: Secondary | ICD-10-CM

## 2013-10-28 DIAGNOSIS — R251 Tremor, unspecified: Secondary | ICD-10-CM

## 2013-10-28 DIAGNOSIS — G4700 Insomnia, unspecified: Secondary | ICD-10-CM | POA: Insufficient documentation

## 2013-10-28 DIAGNOSIS — K5900 Constipation, unspecified: Secondary | ICD-10-CM | POA: Insufficient documentation

## 2013-10-28 MED ORDER — CLONAZEPAM 0.5 MG PO TABS: 0.5000 mg | ORAL_TABLET | Freq: Two times a day (BID) | ORAL | Status: DC | PRN

## 2013-10-28 MED ORDER — TRAMADOL HCL 50 MG PO TABS: 50.0000 mg | ORAL_TABLET | Freq: Four times a day (QID) | ORAL | Status: DC | PRN

## 2013-10-28 MED ORDER — BACLOFEN 10 MG PO TABS: 10.0000 mg | ORAL_TABLET | Freq: Three times a day (TID) | ORAL | Status: DC

## 2013-10-28 MED ORDER — BACLOFEN 10 MG PO TABS: 10.0000 mg | ORAL_TABLET | Freq: Four times a day (QID) | ORAL | Status: DC

## 2013-10-28 NOTE — Patient Instructions (Signed)
Baclofen new prescription is, 4 times per day and 120 tablets per month, this will be at your pharmacy

## 2013-10-28 NOTE — Telephone Encounter (Signed)
Carlos Grimes from [facility] called to verify directions for patient's Baclofen. Contacted pharmacy and informed them that Baclofen directions are take 1 tablet by mouth 4 times daily.

## 2013-10-28 NOTE — Progress Notes (Signed)
Subjective:    Patient ID: Carlos Grimes, male    DOB: April 15, 1946, 68 y.o.   MRN: [id number] Secondary complaint is right ankle stiffness since stroke  Stroke onset 01/02/2005  HPI Knee and ankle pain on Right side only, no history of injury to the right knee or right ankle. Pain increases with walking Patient also complains of tremors all over the body. Denies family history of tremor. No falls Patient independent with all self-care and mobility  Patient complains of strange side effects with various medications such as dry skin from Flomax and Plavix. I did share that I did not think these medications were causing his symptom and that I would recommend continuing these medications in particular the Plavix Pain Inventory Average Pain 7 Pain Right Now 7 My pain is sharp, burning, stabbing, tingling and aching  In the last 24 hours, has pain interfered with the following? General activity 7 Relation with others 10 Enjoyment of life 9 What TIME of day is your pain at its worst? morning Sleep (in general) Fair  Pain is worse with: other Pain improves with: medication Relief from Meds: 6  Mobility use a cane how many minutes can you walk? 30 ability to climb steps?  yes do you drive?  yes transfers alone  Function retired I need assistance with the following:  meal prep, household duties and shopping  Neuro/Psych bladder control problems weakness numbness tremor tingling trouble walking spasms dizziness confusion depression anxiety  Prior Studies Any changes since last visit?  no  Physicians involved in your care Any changes since last visit?  no   Family History  Problem Relation Age of Onset  . Cancer Father   . Heart failure Father   . Diabetes Maternal Grandmother    History   Social History  . Marital Status: Married    Spouse Name: N/A    Number of Children: 86  . Years of Education: N/A   Occupational History  . retired    Social  History Main Topics  . Smoking status: Never Smoker   . Smokeless tobacco: Never Used     Comment: Smokes marijuana  . Alcohol Use: 1.2 oz/week    2 Cans of beer per week  . Drug Use: Yes    Special: Marijuana     Comment: marijauana  . Sexual Activity: None   Other Topics Concern  . None   Social History Narrative  . None   Past Surgical History  Procedure Laterality Date  . Tonsillectomy    . Wisdom tooth extraction     Past Medical History  Diagnosis Date  . Hypertension   . Stroke   . Colitis   . Depression   . Arthritis   . Anxiety   . Insomnia, unspecified   . GERD (gastroesophageal reflux disease)   . Trouble swallowing   . Loss of appetite   . Chronic constipation   . Hearing loss   . Headaches due to old head injury   . Colon polyp   . Hyperlipidemia    BP 146/69  Pulse 67  Resp 14  Ht 5\' 11"  (1.803 m)  Wt 162 lb 3.2 oz (73.573 kg)  BMI 22.63 kg/m2  SpO2 97%  Opioid Risk Score:   Fall Risk Score: High Fall Risk (>13 points) (educated and fall prevention inthe home handout given) Review of Systems  Genitourinary:       Bladder control problems  Musculoskeletal: Positive for gait problem.  Spasms  Neurological: Positive for dizziness, tremors, weakness and numbness.       Tingling  Psychiatric/Behavioral: Positive for confusion and dysphoric mood. The patient is nervous/anxious.   All other systems reviewed and are negative.       Objective:   Physical Exam  Ambulates without cane but has mild toe drag on the right side as well as stiff leg and gait. Hip hiking on the right side Normal range of motion in the shoulders but has pain with shoulder abduction on the right side.  Motor strength is 5/5 bilateral deltoid, bicep, tricep, grip Has decreased fine motor skills in the right hand. Evidence of dysdiadochokinesis on the right side with rapid alternating supination pronation.  Motor strength is 5/5 bilateral knee extensors 3 minus  right ankle dorsiflexor 5/5 left ankle dorsiflexor      Assessment & Plan:  1. 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  Do not think narcotic analgesics are the best treatment option for this patient. First there is really no literature to support this, second he has elevated opioid risk total score, third he's had complications with constipation.  Recommend continued combination treatment tramadol 50 mg 4 times a day,Increase baclofen 10 mg 4 times a day  Order for right AFO, this should help with foot and ankle pain on the right side which appears to be spasticity related Over half of the 25 min visit was spent counseling and coordinating care.

## 2013-11-08 ENCOUNTER — Telehealth: Payer: Self-pay

## 2013-11-08 NOTE — Telephone Encounter (Signed)
Called patient to find out what medications he has tried in the past regarding his anxiety.  His insurance is requiring prior auth for clonazepam.  Patient says he has tried xanax and ativan in the past.

## 2013-11-11 ENCOUNTER — Ambulatory Visit: Payer: Medicare Other | Admitting: Family Medicine

## 2013-12-06 ENCOUNTER — Other Ambulatory Visit: Payer: Self-pay | Admitting: *Deleted

## 2013-12-06 MED ORDER — AMLODIPINE BESYLATE 10 MG PO TABS: 10.0000 mg | ORAL_TABLET | Freq: Every day | ORAL | Status: DC

## 2013-12-06 NOTE — Telephone Encounter (Signed)
Received a fax from [facility] for refill on Amlodipine 10mg . Takes one daily. The last strength in Epic is 5mg . Pt refilled 10mg  on 10/19/13. Please review.

## 2013-12-09 ENCOUNTER — Ambulatory Visit (HOSPITAL_BASED_OUTPATIENT_CLINIC_OR_DEPARTMENT_OTHER): Payer: Medicare Other | Admitting: Physical Medicine & Rehabilitation

## 2013-12-09 ENCOUNTER — Encounter: Payer: Medicare Other | Attending: Physical Medicine & Rehabilitation

## 2013-12-09 ENCOUNTER — Encounter: Payer: Self-pay | Admitting: Physical Medicine & Rehabilitation

## 2013-12-09 VITALS — BP 141/78 | HR 65 | Resp 14 | Ht 71.0 in | Wt 155.0 lb

## 2013-12-09 DIAGNOSIS — G890 Central pain syndrome: Secondary | ICD-10-CM | POA: Insufficient documentation

## 2013-12-09 DIAGNOSIS — F411 Generalized anxiety disorder: Secondary | ICD-10-CM | POA: Insufficient documentation

## 2013-12-09 DIAGNOSIS — E785 Hyperlipidemia, unspecified: Secondary | ICD-10-CM | POA: Insufficient documentation

## 2013-12-09 DIAGNOSIS — K5900 Constipation, unspecified: Secondary | ICD-10-CM | POA: Insufficient documentation

## 2013-12-09 DIAGNOSIS — G4700 Insomnia, unspecified: Secondary | ICD-10-CM | POA: Insufficient documentation

## 2013-12-09 DIAGNOSIS — F329 Major depressive disorder, single episode, unspecified: Secondary | ICD-10-CM | POA: Insufficient documentation

## 2013-12-09 DIAGNOSIS — F3289 Other specified depressive episodes: Secondary | ICD-10-CM | POA: Insufficient documentation

## 2013-12-09 DIAGNOSIS — I10 Essential (primary) hypertension: Secondary | ICD-10-CM | POA: Insufficient documentation

## 2013-12-09 DIAGNOSIS — Z860100 Personal history of colon polyps, unspecified: Secondary | ICD-10-CM | POA: Insufficient documentation

## 2013-12-09 DIAGNOSIS — I69959 Hemiplegia and hemiparesis following unspecified cerebrovascular disease affecting unspecified side: Secondary | ICD-10-CM | POA: Insufficient documentation

## 2013-12-09 DIAGNOSIS — K219 Gastro-esophageal reflux disease without esophagitis: Secondary | ICD-10-CM | POA: Insufficient documentation

## 2013-12-09 DIAGNOSIS — G8110 Spastic hemiplegia affecting unspecified side: Secondary | ICD-10-CM

## 2013-12-09 NOTE — Progress Notes (Signed)
Subjective:    Patient ID: Carlos Grimes, male    DOB: [birth year], 68 y.o.   MRN: [id number]  HPI Patient has is right AFO. He feels like it is helping with his ambulation. His only problem is on inclines. He plans to go back to the orthotist to get some adjustment. Pain rather well controlled. He is trying to come off his medications as much as possible. He really takes his tramadol only twice a day on average. In addition he has been reducing his baclofen dose. He feels like he gets some dizziness as a side effect Pain Inventory Average Pain 5 Pain Right Now 3 My pain is sharp  In the last 24 hours, has pain interfered with the following? General activity 5 Relation with others 5 Enjoyment of life 5 What TIME of day is your pain at its worst? evening, night Sleep (in general) Fair  Pain is worse with: walking Pain improves with: rest Relief from Meds: na  Mobility walk with assistance use a cane transfers alone Do you have any goals in this area?  no  Function retired I need assistance with the following:  shopping Do you have any goals in this area?  no  Neuro/Psych bladder control problems weakness numbness tremor tingling trouble walking spasms dizziness depression anxiety  Prior Studies Any changes since last visit?  no  Physicians involved in your care Any changes since last visit?  no   Family History  Problem Relation Age of Onset  . Cancer Father   . Heart failure Father   . Diabetes Maternal Grandmother    History   Social History  . Marital Status: Married    Spouse Name: N/A    Number of Children: 58  . Years of Education: N/A   Occupational History  . retired    Social History Main Topics  . Smoking status: Never Smoker   . Smokeless tobacco: Never Used     Comment: Smokes marijuana  . Alcohol Use: 1.2 oz/week    2 Cans of beer per week  . Drug Use: Yes    Special: Marijuana     Comment: marijauana  . Sexual Activity:  None   Other Topics Concern  . None   Social History Narrative  . None   Past Surgical History  Procedure Laterality Date  . Tonsillectomy    . Wisdom tooth extraction     Past Medical History  Diagnosis Date  . Hypertension   . Stroke   . Colitis   . Depression   . Arthritis   . Anxiety   . Insomnia, unspecified   . GERD (gastroesophageal reflux disease)   . Trouble swallowing   . Loss of appetite   . Chronic constipation   . Hearing loss   . Headaches due to old head injury   . Colon polyp   . Hyperlipidemia    BP 141/78  Pulse 65  Resp 14  Ht 5\' 11"  (1.803 m)  Wt 155 lb (70.308 kg)  BMI 21.63 kg/m2  SpO2 98%  Opioid Risk Score:   Fall Risk Score: Moderate Fall Risk (6-13 points) (pt educated and given brochure on fall risk)    Review of Systems  Genitourinary:       Bladder control problems   Neurological: Positive for dizziness, tremors, weakness and numbness.       Tingling,spasms  Psychiatric/Behavioral: Positive for dysphoric mood. The patient is nervous/anxious.   All other systems reviewed and are negative.  Objective:   Physical Exam  Ambulates without cane but has mild toe drag on the right side as well as stiff leg and gait. Hip hiking on the right side  Normal range of motion in the shoulders but has pain with shoulder abduction on the right side.  Motor strength is 5/5 bilateral deltoid, bicep, tricep, grip  Has decreased fine motor skills in the right hand.  Evidence of dysdiadochokinesis on the right side with rapid alternating supination pronation.  Motor strength is 5/5 bilateral knee extensors  3 minus right ankle dorsiflexor  5/5 left ankle dorsiflexor       Assessment & Plan:  1. 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   Recommend continued combination treatment tramadol 50 mg 3 times a day,Increase baclofen 10 mg 2-3 tab/day right AFO, this should help with foot and  ankle pain , received it yesterday still getting some minor adjustments Return to clinic 6 month

## 2013-12-09 NOTE — Patient Instructions (Signed)
Continue current medications 6 month followup PT using brace Discuss stretching exercises for ankle

## 2013-12-21 ENCOUNTER — Telehealth: Payer: Self-pay | Admitting: Family Medicine

## 2013-12-21 NOTE — Telephone Encounter (Signed)
appt tomorrow with Dietrich Pates

## 2013-12-22 ENCOUNTER — Ambulatory Visit (INDEPENDENT_AMBULATORY_CARE_PROVIDER_SITE_OTHER): Payer: Medicare Other | Admitting: Family Medicine

## 2013-12-22 ENCOUNTER — Encounter: Payer: Self-pay | Admitting: Family Medicine

## 2013-12-22 VITALS — BP 116/72 | HR 60 | Temp 98.1°F | Ht 71.0 in | Wt 156.8 lb

## 2013-12-22 DIAGNOSIS — L989 Disorder of the skin and subcutaneous tissue, unspecified: Secondary | ICD-10-CM

## 2013-12-22 MED ORDER — DOXYCYCLINE HYCLATE 100 MG PO TABS: 100.0000 mg | ORAL_TABLET | Freq: Two times a day (BID) | ORAL | Status: DC

## 2013-12-22 NOTE — Progress Notes (Signed)
   Subjective:    Patient ID: Daschel Roughton, male    DOB: [birth year], 68 y.o.   MRN: [id number]  HPI  This 68 y.o. male presents for evaluation of cellulitis of the umbilicus, skin lesion on back, and He wants tetanus shot.  He states he has not had a tetanus shot in over 10 years.  Review of Systems C/o rash and cellulitis in umbilicus No chest pain, SOB, HA, dizziness, vision change, N/V, diarrhea, constipation, dysuria, urinary urgency or frequency, myalgias, arthralgias or rash.     Objective:   Physical Exam Vital signs noted  Chronically ill appearing male in NAD.  HEENT - Head atraumatic Normocephalic                Eyes - PERRLA, Conjuctiva - clear Sclera- Clear EOMI Respiratory - Lungs CTA bilateral Cardiac - RRR S1 and S2 without murmur GI - Abdomen soft Nontender and bowel sounds active x 4      Assessment & Plan:  Skin lesion - Plan: doxycycline (VIBRA-TABS) 100 MG tablet, Tdap vaccine greater than or equal to 7yo IM, Ambulatory referral to Dermatology  Cellulitis - doxycycline 100mg  po bid x 10 days #20  Lysbeth Penner FNP

## 2014-01-11 ENCOUNTER — Other Ambulatory Visit: Payer: Self-pay | Admitting: *Deleted

## 2014-01-11 MED ORDER — AMLODIPINE BESYLATE 10 MG PO TABS: 10.0000 mg | ORAL_TABLET | Freq: Every day | ORAL | Status: DC

## 2014-01-11 MED ORDER — CLOPIDOGREL BISULFATE 75 MG PO TABS: 75.0000 mg | ORAL_TABLET | Freq: Every evening | ORAL | Status: DC

## 2014-02-14 ENCOUNTER — Other Ambulatory Visit: Payer: Self-pay | Admitting: *Deleted

## 2014-02-14 MED ORDER — POLYETHYLENE GLYCOL 3350 17 GM/SCOOP PO POWD: ORAL | Status: DC

## 2014-03-01 ENCOUNTER — Other Ambulatory Visit: Payer: Self-pay

## 2014-03-01 DIAGNOSIS — R251 Tremor, unspecified: Secondary | ICD-10-CM

## 2014-03-01 MED ORDER — CLONAZEPAM 0.5 MG PO TABS: 0.5000 mg | ORAL_TABLET | Freq: Two times a day (BID) | ORAL | Status: DC | PRN

## 2014-03-01 NOTE — Telephone Encounter (Signed)
Pharmacy request received to refill Clonazepam. Refill called in at pharmacy with 3 add'l refills. Patient is aware.

## 2014-03-22 ENCOUNTER — Other Ambulatory Visit: Payer: Self-pay | Admitting: Family Medicine

## 2014-03-22 ENCOUNTER — Ambulatory Visit: Payer: Medicare Other | Admitting: Family Medicine

## 2014-03-28 ENCOUNTER — Other Ambulatory Visit: Payer: Self-pay | Admitting: Family Medicine

## 2014-05-03 ENCOUNTER — Other Ambulatory Visit: Payer: Self-pay

## 2014-05-03 MED ORDER — TRAMADOL HCL 50 MG PO TABS: 50.0000 mg | ORAL_TABLET | Freq: Four times a day (QID) | ORAL | Status: DC | PRN

## 2014-05-03 MED ORDER — BACLOFEN 10 MG PO TABS: 10.0000 mg | ORAL_TABLET | Freq: Four times a day (QID) | ORAL | Status: DC

## 2014-06-07 ENCOUNTER — Telehealth: Payer: Self-pay | Admitting: *Deleted

## 2014-06-07 NOTE — Telephone Encounter (Signed)
Carlos Grimes called asking when his appt is scheduled.  He has a new phone # [phone].  Left message to return call.

## 2014-06-12 ENCOUNTER — Ambulatory Visit: Payer: Medicare Other | Admitting: Physical Medicine & Rehabilitation

## 2014-06-23 ENCOUNTER — Ambulatory Visit (HOSPITAL_BASED_OUTPATIENT_CLINIC_OR_DEPARTMENT_OTHER): Payer: Medicare Other | Admitting: Physical Medicine & Rehabilitation

## 2014-06-23 ENCOUNTER — Encounter: Payer: Medicare Other | Attending: Physical Medicine & Rehabilitation

## 2014-06-23 ENCOUNTER — Encounter: Payer: Self-pay | Admitting: Physical Medicine & Rehabilitation

## 2014-06-23 ENCOUNTER — Other Ambulatory Visit: Payer: Self-pay | Admitting: Family Medicine

## 2014-06-23 VITALS — BP 123/76 | HR 65 | Resp 14 | Wt 149.4 lb

## 2014-06-23 DIAGNOSIS — G890 Central pain syndrome: Secondary | ICD-10-CM | POA: Diagnosis not present

## 2014-06-23 DIAGNOSIS — R251 Tremor, unspecified: Secondary | ICD-10-CM

## 2014-06-23 DIAGNOSIS — M2241 Chondromalacia patellae, right knee: Secondary | ICD-10-CM | POA: Diagnosis not present

## 2014-06-23 DIAGNOSIS — G8110 Spastic hemiplegia affecting unspecified side: Secondary | ICD-10-CM

## 2014-06-23 MED ORDER — CLONAZEPAM 0.5 MG PO TABS
0.5000 mg | ORAL_TABLET | Freq: Two times a day (BID) | ORAL | Status: DC | PRN
Start: 2014-06-23 — End: 2014-11-01

## 2014-06-23 MED ORDER — DICLOFENAC SODIUM 1 % TD GEL: 2.0000 g | Freq: Four times a day (QID) | TRANSDERMAL | Status: DC

## 2014-06-23 NOTE — Patient Instructions (Signed)
Try Voltaren gel 4 times a day to right knee

## 2014-06-23 NOTE — Progress Notes (Signed)
Subjective:    Patient ID: Carlos Grimes, male    DOB: 1946-07-04, 68 y.o.   MRN: [id number]  HPI Patient has is right AFO. He feels like it is helping with his ambulation.does't wear everyday.   His only problem is on inclines. He plans to go back to the orthotist to get some adjustment.  Pain rather well controlled. He is trying to come off his medications as much as possible. He really takes his tramadol only twice a day on average. In addition he has been reducing his baclofen dose.   Review of Systems  Genitourinary:       Bladder control problems  Musculoskeletal:       Spasms  Neurological: Positive for tremors, weakness and numbness.       Tingling  Psychiatric/Behavioral: Positive for confusion and dysphoric mood. The patient is nervous/anxious.   All other systems reviewed and are negative.  Pain Inventory Average Pain 6 Pain Right Now 6 My pain is sharp, stabbing, tingling and aching  In the last 24 hours, has pain interfered with the following? General activity 7 Relation with others 5 Enjoyment of life 9 What TIME of day is your pain at its worst? morning and night Sleep (in general) Fair  Pain is worse with: walking Pain improves with: rest and medication Relief from Meds: 6  Mobility walk without assistance how many minutes can you walk? 20  Function retired I need assistance with the following:  shopping  Neuro/Psych bladder control problems weakness numbness tremor tingling spasms confusion depression anxiety  Prior Studies Any changes since last visit?  no  Physicians involved in your care Any changes since last visit?  no   Family History  Problem Relation Age of Onset  . Cancer Father   . Heart failure Father   . Diabetes Maternal Grandmother    History   Social History  . Marital Status: Married    Spouse Name: N/A    Number of Children: 20  . Years of Education: N/A   Occupational History  . retired    Social  History Main Topics  . Smoking status: Never Smoker   . Smokeless tobacco: Never Used     Comment: Smokes marijuana  . Alcohol Use: 1.2 oz/week    2 Cans of beer per week  . Drug Use: Yes    Special: Marijuana     Comment: marijauana  . Sexual Activity: None   Other Topics Concern  . None   Social History Narrative  . None   Past Surgical History  Procedure Laterality Date  . Tonsillectomy    . Wisdom tooth extraction     Past Medical History  Diagnosis Date  . Hypertension   . Stroke   . Colitis   . Depression   . Arthritis   . Anxiety   . Insomnia, unspecified   . GERD (gastroesophageal reflux disease)   . Trouble swallowing   . Loss of appetite   . Chronic constipation   . Hearing loss   . Headaches due to old head injury   . Colon polyp   . Hyperlipidemia    BP 123/76  Pulse 65  Resp 14  Wt 149 lb 6.4 oz (67.767 kg)  SpO2 98%  Opioid Risk Score:   Fall Risk Score: Moderate Fall Risk (6-13 points) (educated and given handout) Review of Systems  Genitourinary:       Bladder control problems  Musculoskeletal:  Spasms  Neurological: Positive for tremors, weakness and numbness.       Tingling  Psychiatric/Behavioral: Positive for confusion and dysphoric mood. The patient is nervous/anxious.   All other systems reviewed and are negative.      Objective:   Physical Exam  Ambulates without cane but has mild toe drag on the right side as well as stiff leg and gait. Hip hiking on the right side  Normal range of motion in the shoulders but has pain with shoulder abduction on the right side.  Motor strength is 5/5 bilateral deltoid, bicep, tricep, grip  Has decreased fine motor skills in the right hand.  Evidence of dysdiadochokinesis on the right side with rapid alternating supination pronation.  Motor strength is 5/5 bilateral knee extensors  3 minus right ankle dorsiflexor  5/5 left ankle dorsiflexor       Assessment & Plan:  1. 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  Recommend continued combination treatment tramadol 50 mg 3 times a day,cont baclofen 10 mg 2-3 tab/day  right AFO, Return to clinic 6 month 2.  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

## 2014-06-26 NOTE — Telephone Encounter (Signed)
Last seen 12/22/13  B [place]   Requesting a 90 day supply

## 2014-07-06 ENCOUNTER — Telehealth: Payer: Self-pay | Admitting: *Deleted

## 2014-07-06 NOTE — Telephone Encounter (Signed)
Pt. informs that he saw Dr. Alden Server last week (06/23/2014) and was prescribed a local pain medication for his knee. He has been unable to obtain the prescription and was calling to check in? I presume to see if we knew why his medication were not ready for pickup.

## 2014-07-08 NOTE — Telephone Encounter (Signed)
voltaren gel apply QID to knee 1 RF

## 2014-07-10 ENCOUNTER — Other Ambulatory Visit: Payer: Self-pay | Admitting: *Deleted

## 2014-07-10 DIAGNOSIS — G894 Chronic pain syndrome: Secondary | ICD-10-CM

## 2014-07-10 MED ORDER — DICLOFENAC SODIUM 1 % TD GEL: 2.0000 g | Freq: Four times a day (QID) | TRANSDERMAL | Status: DC

## 2014-07-10 NOTE — Telephone Encounter (Signed)
Ok to refill Voltaren gel and apply QID

## 2014-07-13 NOTE — Telephone Encounter (Signed)
Pre authorization sent by Olean Ree 07/13/2014

## 2014-10-20 ENCOUNTER — Ambulatory Visit (HOSPITAL_BASED_OUTPATIENT_CLINIC_OR_DEPARTMENT_OTHER): Payer: Medicare Other | Admitting: Physical Medicine & Rehabilitation

## 2014-10-20 ENCOUNTER — Encounter: Payer: Medicare Other | Attending: Physical Medicine & Rehabilitation

## 2014-10-20 ENCOUNTER — Encounter: Payer: Self-pay | Admitting: Physical Medicine & Rehabilitation

## 2014-10-20 VITALS — BP 115/82 | HR 72 | Resp 14

## 2014-10-20 DIAGNOSIS — M2240 Chondromalacia patellae, unspecified knee: Secondary | ICD-10-CM | POA: Insufficient documentation

## 2014-10-20 DIAGNOSIS — G894 Chronic pain syndrome: Secondary | ICD-10-CM | POA: Insufficient documentation

## 2014-10-20 DIAGNOSIS — G8110 Spastic hemiplegia affecting unspecified side: Secondary | ICD-10-CM | POA: Diagnosis not present

## 2014-10-20 MED ORDER — DICLOFENAC SODIUM 1 % TD GEL: 2.0000 g | Freq: Four times a day (QID) | TRANSDERMAL | Status: DC

## 2014-10-20 MED ORDER — BACLOFEN 10 MG PO TABS: 10.0000 mg | ORAL_TABLET | Freq: Four times a day (QID) | ORAL | Status: DC

## 2014-10-20 MED ORDER — TRAMADOL HCL 50 MG PO TABS: 50.0000 mg | ORAL_TABLET | Freq: Four times a day (QID) | ORAL | Status: DC | PRN

## 2014-10-20 NOTE — Progress Notes (Signed)
Subjective:    Patient ID: Carlos Grimes, male    DOB: 02/08/1946, 69 y.o.   MRN: [id number]  HPI Remote CVA with mild right hemiparesis as well as central postroke-pain syndrome Planning to go to [facility] for the rest of the winter No side effects from tramadol or baclofen Only uses AFO When ambulating longer distances Pain Inventory Average Pain 3 Pain Right Now 3 My pain is sharp, dull, tingling and aching  In the last 24 hours, has pain interfered with the following? General activity 8 Relation with others 5 Enjoyment of life 5 What TIME of day is your pain at its worst? morning, night Sleep (in general) Fair  Pain is worse with: walking Pain improves with: rest and medication Relief from Meds: 6  Mobility walk with assistance use a cane how many minutes can you walk? 30 ability to climb steps?  yes do you drive?  yes Do you have any goals in this area?  no  Function not employed: date last employed . retired I need assistance with the following:  meal prep, household duties and shopping  Neuro/Psych weakness numbness tremor tingling spasms  Prior Studies Any changes since last visit?  no  Physicians involved in your care Any changes since last visit?  no   Family History  Problem Relation Age of Onset  . Cancer Father   . Heart failure Father   . Diabetes Maternal Grandmother    History   Social History  . Marital Status: Married    Spouse Name: N/A    Number of Children: 60  . Years of Education: N/A   Occupational History  . retired    Social History Main Topics  . Smoking status: Never Smoker   . Smokeless tobacco: Never Used     Comment: Smokes marijuana  . Alcohol Use: 1.2 oz/week    2 Cans of beer per week  . Drug Use: Yes    Special: Marijuana     Comment: marijauana  . Sexual Activity: None   Other Topics Concern  . None   Social History Narrative   Past Surgical History  Procedure Laterality Date  .  Tonsillectomy    . Wisdom tooth extraction     Past Medical History  Diagnosis Date  . Hypertension   . Stroke   . Colitis   . Depression   . Arthritis   . Anxiety   . Insomnia, unspecified   . GERD (gastroesophageal reflux disease)   . Trouble swallowing   . Loss of appetite   . Chronic constipation   . Hearing loss   . Headaches due to old head injury   . Colon polyp   . Hyperlipidemia    BP 115/82 mmHg  Pulse 72  Resp 14  SpO2 97%  Opioid Risk Score:   Fall Risk Score:     Review of Systems  Constitutional: Negative for activity change.  Neurological: Positive for weakness and numbness.       Tingling Spasms   All other systems reviewed and are negative.      Objective:   Physical Exam Ambulates without AFO using a steppage gait pattern. Right lower extremity strength is 5 minus/5 but has increased extensor tone.Exception is right TA 3/5 Right upper extremity has 5 minus over 5 strength but no evidence of excessive tone 5/5 on the left side.  Right knee has no tenderness palpation around the patella area. No evidence of knee effusion. Full range of  motion      Assessment & Plan:  1. 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  Recommend continued combination treatment tramadol 50 mg 3 times a day,cont baclofen 10 mg 2-3 tab/day  right AFO uses occ, Return to clinic 6 month  2. Patellar chondromalacia- will Reorder voltaren gel,Patient feels this is very beneficial

## 2014-10-20 NOTE — Patient Instructions (Signed)
Would recommend wearing the AFO for longer distances

## 2014-11-01 ENCOUNTER — Other Ambulatory Visit: Payer: Self-pay | Admitting: *Deleted

## 2014-11-01 DIAGNOSIS — R251 Tremor, unspecified: Secondary | ICD-10-CM

## 2014-11-01 MED ORDER — CLONAZEPAM 0.5 MG PO TABS: 0.5000 mg | ORAL_TABLET | Freq: Two times a day (BID) | ORAL | Status: DC | PRN

## 2015-02-26 ENCOUNTER — Encounter: Payer: Medicare Other | Attending: Physical Medicine & Rehabilitation

## 2015-02-26 ENCOUNTER — Encounter: Payer: Self-pay | Admitting: Physical Medicine & Rehabilitation

## 2015-02-26 ENCOUNTER — Ambulatory Visit (HOSPITAL_BASED_OUTPATIENT_CLINIC_OR_DEPARTMENT_OTHER): Payer: Medicare Other | Admitting: Physical Medicine & Rehabilitation

## 2015-02-26 DIAGNOSIS — M2240 Chondromalacia patellae, unspecified knee: Secondary | ICD-10-CM | POA: Insufficient documentation

## 2015-02-26 DIAGNOSIS — G894 Chronic pain syndrome: Secondary | ICD-10-CM | POA: Diagnosis not present

## 2015-02-26 DIAGNOSIS — G8110 Spastic hemiplegia affecting unspecified side: Secondary | ICD-10-CM | POA: Diagnosis not present

## 2015-02-26 DIAGNOSIS — G890 Central pain syndrome: Secondary | ICD-10-CM

## 2015-02-26 MED ORDER — TRAMADOL HCL 50 MG PO TABS: 50.0000 mg | ORAL_TABLET | Freq: Four times a day (QID) | ORAL | Status: DC | PRN

## 2015-02-26 NOTE — Progress Notes (Signed)
Subjective:    Patient ID: Carlos Grimes, male    DOB: 18-May-1946, 69 y.o.   MRN: [id number]  HPI Remote CVA with mild right hemiparesis as well as central postroke-pain syndrome  Left his "mean ass" wife  No side effects from tramadol or baclofen  Occ uses AFO When ambulating longer distances Pain Inventory Average Pain 3 Pain Right Now 5 My pain is sharp, stabbing and aching  In the last 24 hours, has pain interfered with the following? General activity 8 Relation with others 4 Enjoyment of life 7 What TIME of day is your pain at its worst? morning and night Sleep (in general) Good  Pain is worse with: unsure Pain improves with: rest, therapy/exercise and medication Relief from Meds: 5  Mobility use a cane how many minutes can you walk? 30 ability to climb steps?  yes do you drive?  yes  Function retired I need assistance with the following:  meal prep, household duties and shopping  Neuro/Psych bladder control problems weakness numbness tremor trouble walking spasms anxiety  Prior Studies Any changes since last visit?  no  Physicians involved in your care Any changes since last visit?  no   Family History  Problem Relation Age of Onset  . Cancer Father   . Heart failure Father   . Diabetes Maternal Grandmother    History   Social History  . Marital Status: Married    Spouse Name: N/A  . Number of Children: 5  . Years of Education: N/A   Occupational History  . retired    Social History Main Topics  . Smoking status: Never Smoker   . Smokeless tobacco: Never Used     Comment: Smokes marijuana  . Alcohol Use: 1.2 oz/week    2 Cans of beer per week  . Drug Use: Yes    Special: Marijuana     Comment: marijauana  . Sexual Activity: Not on file   Other Topics Concern  . None   Social History Narrative   Past Surgical History  Procedure Laterality Date  . Tonsillectomy    . Wisdom tooth extraction     Past Medical History    Diagnosis Date  . Hypertension   . Stroke   . Colitis   . Depression   . Arthritis   . Anxiety   . Insomnia, unspecified   . GERD (gastroesophageal reflux disease)   . Trouble swallowing   . Loss of appetite   . Chronic constipation   . Hearing loss   . Headaches due to old head injury   . Colon polyp   . Hyperlipidemia    There were no vitals taken for this visit.  Opioid Risk Score:   Fall Risk Score: Moderate Fall Risk (6-13 points) (previously educated and given handout)`1  Depression screen PHQ 2/9  Depression screen [facility] 2/9 02/26/2015 09/21/2013  Decreased Interest 2 3  Down, Depressed, Hopeless 2 2  PHQ - 2 Score 4 5  Altered sleeping 1 3  Tired, decreased energy 2 3  Change in appetite 0 0  Feeling bad or failure about yourself  2 2  Trouble concentrating 1 1  Moving slowly or fidgety/restless 1 1  Suicidal thoughts 0 0  PHQ-9 Score 11 15     Review of Systems  Genitourinary:       Bladder control problems  Musculoskeletal: Positive for gait problem.       Spasms  Neurological: Positive for tremors, weakness and numbness.  Psychiatric/Behavioral: The patient is nervous/anxious.   All other systems reviewed and are negative.      Objective:   Physical Exam  Constitutional: He is oriented to person, place, and time. He appears well-developed and well-nourished.  HENT:  Head: Normocephalic and atraumatic.  Eyes: Conjunctivae are normal. Pupils are equal, round, and reactive to light.  Neurological: He is alert and oriented to person, place, and time. He displays no tremor. No sensory deficit. Coordination abnormal.  Reflex Scores:      Tricep reflexes are 3+ on the right side and 2+ on the left side.      Bicep reflexes are 3+ on the right side and 2+ on the left side.      Brachioradialis reflexes are 3+ on the right side and 2+ on the left side.      Patellar reflexes are 3+ on the right side and 2+ on the left side.      Achilles reflexes are 3+ on  the right side and 2+ on the left side. Mild fine motor deficits finger thumb opposition as well as dysdiadochokinesis on the right side  Psychiatric: He has a normal mood and affect.  Nursing note and vitals reviewed.  Motor strength is 4/5 in the right deltoid, biceps, triceps, grip 5/5 in left deltoid, bicep contrast, grip 5/5 bilateral hip flexors and knee extensors 4/5 in the right ankle dorsal flexor 5/5 and left ankle dorsi flexor        Assessment & Plan:  1. Right spastic hemiplegia mild with residual right thigh motor deficits following remote CVA. Has some chronic post hemiplegic pain as well as spasticity. Continue baclofen10 mg 4 times a day Continue tramadol 50 mg 4 times a day  Return to clinic in 6 months

## 2015-03-01 ENCOUNTER — Other Ambulatory Visit: Payer: Self-pay | Admitting: *Deleted

## 2015-03-01 DIAGNOSIS — R251 Tremor, unspecified: Secondary | ICD-10-CM

## 2015-03-01 MED ORDER — CLONAZEPAM 0.5 MG PO TABS: 0.5000 mg | ORAL_TABLET | Freq: Two times a day (BID) | ORAL | Status: DC | PRN

## 2015-03-07 ENCOUNTER — Encounter: Payer: Self-pay | Admitting: *Deleted

## 2015-03-08 ENCOUNTER — Telehealth: Payer: Self-pay | Admitting: *Deleted

## 2015-03-08 NOTE — Telephone Encounter (Signed)
[facility] called asking for additional information regarding pt's PA for diclofenac sodium gel. Information provided, PA has been approved

## 2015-03-27 ENCOUNTER — Telehealth: Payer: Self-pay | Admitting: *Deleted

## 2015-03-27 NOTE — Telephone Encounter (Signed)
Prior Authorization approved from 03/08/2015 thru 03/06/2016 GPI/NDC: [id number]

## 2015-04-17 ENCOUNTER — Ambulatory Visit: Payer: Medicare Other | Admitting: Physical Medicine & Rehabilitation

## 2015-05-18 ENCOUNTER — Other Ambulatory Visit: Payer: Self-pay | Admitting: *Deleted

## 2015-05-18 MED ORDER — BACLOFEN 10 MG PO TABS: 10.0000 mg | ORAL_TABLET | Freq: Four times a day (QID) | ORAL | Status: DC

## 2015-05-18 MED ORDER — TRAMADOL HCL 50 MG PO TABS: 50.0000 mg | ORAL_TABLET | Freq: Four times a day (QID) | ORAL | Status: DC | PRN

## 2015-06-12 ENCOUNTER — Other Ambulatory Visit: Payer: Self-pay | Admitting: *Deleted

## 2015-06-12 DIAGNOSIS — G894 Chronic pain syndrome: Secondary | ICD-10-CM

## 2015-06-12 MED ORDER — DICLOFENAC SODIUM 1 % TD GEL: 2.0000 g | Freq: Four times a day (QID) | TRANSDERMAL | Status: DC

## 2015-07-16 ENCOUNTER — Telehealth: Payer: Self-pay | Admitting: Physical Medicine & Rehabilitation

## 2015-07-16 DIAGNOSIS — R251 Tremor, unspecified: Secondary | ICD-10-CM

## 2015-07-16 MED ORDER — CLONAZEPAM 0.5 MG PO TABS: 0.5000 mg | ORAL_TABLET | Freq: Two times a day (BID) | ORAL | Status: DC | PRN

## 2015-07-16 NOTE — Telephone Encounter (Signed)
Okay to bridge until next appointment Klonopin 0.5 twice a day when necessary

## 2015-07-16 NOTE — Telephone Encounter (Signed)
Phoned in RX as listed below. Patient advised. [facility]

## 2015-07-16 NOTE — Telephone Encounter (Signed)
Wants refill on klonopin. Last appointment was 02-26-15.

## 2015-07-27 ENCOUNTER — Ambulatory Visit (HOSPITAL_BASED_OUTPATIENT_CLINIC_OR_DEPARTMENT_OTHER): Payer: Medicare Other | Admitting: Physical Medicine & Rehabilitation

## 2015-07-27 ENCOUNTER — Encounter: Payer: Self-pay | Admitting: Physical Medicine & Rehabilitation

## 2015-07-27 ENCOUNTER — Encounter: Payer: Medicare Other | Attending: Physical Medicine & Rehabilitation

## 2015-07-27 VITALS — BP 137/88 | HR 66

## 2015-07-27 DIAGNOSIS — K219 Gastro-esophageal reflux disease without esophagitis: Secondary | ICD-10-CM | POA: Insufficient documentation

## 2015-07-27 DIAGNOSIS — R630 Anorexia: Secondary | ICD-10-CM | POA: Diagnosis not present

## 2015-07-27 DIAGNOSIS — G8110 Spastic hemiplegia affecting unspecified side: Secondary | ICD-10-CM

## 2015-07-27 DIAGNOSIS — Z79899 Other long term (current) drug therapy: Secondary | ICD-10-CM | POA: Insufficient documentation

## 2015-07-27 DIAGNOSIS — I639 Cerebral infarction, unspecified: Secondary | ICD-10-CM | POA: Diagnosis not present

## 2015-07-27 DIAGNOSIS — F1210 Cannabis abuse, uncomplicated: Secondary | ICD-10-CM | POA: Insufficient documentation

## 2015-07-27 DIAGNOSIS — H9190 Unspecified hearing loss, unspecified ear: Secondary | ICD-10-CM | POA: Insufficient documentation

## 2015-07-27 DIAGNOSIS — R51 Headache: Secondary | ICD-10-CM | POA: Insufficient documentation

## 2015-07-27 DIAGNOSIS — R251 Tremor, unspecified: Secondary | ICD-10-CM | POA: Diagnosis not present

## 2015-07-27 DIAGNOSIS — I10 Essential (primary) hypertension: Secondary | ICD-10-CM | POA: Insufficient documentation

## 2015-07-27 DIAGNOSIS — K529 Noninfective gastroenteritis and colitis, unspecified: Secondary | ICD-10-CM | POA: Insufficient documentation

## 2015-07-27 DIAGNOSIS — G4700 Insomnia, unspecified: Secondary | ICD-10-CM | POA: Insufficient documentation

## 2015-07-27 DIAGNOSIS — F329 Major depressive disorder, single episode, unspecified: Secondary | ICD-10-CM | POA: Insufficient documentation

## 2015-07-27 MED ORDER — TRAMADOL HCL 50 MG PO TABS: 50.0000 mg | ORAL_TABLET | Freq: Four times a day (QID) | ORAL | Status: DC | PRN

## 2015-07-27 MED ORDER — CLONAZEPAM 0.5 MG PO TABS: 0.5000 mg | ORAL_TABLET | Freq: Two times a day (BID) | ORAL | Status: DC | PRN

## 2015-07-27 MED ORDER — BACLOFEN 10 MG PO TABS: 10.0000 mg | ORAL_TABLET | Freq: Four times a day (QID) | ORAL | Status: DC

## 2015-07-27 NOTE — Progress Notes (Signed)
Subjective:    Patient ID: Carlos Grimes, male    DOB: 13-Jul-1946, 69 y.o.   MRN: [id number]  HPI Pain wise doing ok RBCs were low, eating more liver, last CBC ok Patient is going out to [place] to help his daughter who is an injured war veteran Pain Inventory Average Pain 4 Pain Right Now 4 My pain is sharp, burning, dull and tingling  In the last 24 hours, has pain interfered with the following? General activity 4 Relation with others 4 Enjoyment of life 8 What TIME of day is your pain at its worst? NA Sleep (in general) NA  Pain is worse with: walking and inactivity Pain improves with: rest and medication Relief from Meds: 7  Mobility walk with assistance use a cane how many minutes can you walk? 30 Do you have any goals in this area?  no  Function not employed: date last employed 12/2004 I need assistance with the following:  shopping  Neuro/Psych bladder control problems numbness tremor spasms confusion anxiety  Prior Studies Any changes since last visit?  yes  Physicians involved in your care Any changes since last visit?  no   Family History  Problem Relation Age of Onset  . Cancer Father   . Heart failure Father   . Diabetes Maternal Grandmother    Social History   Social History  . Marital Status: Married    Spouse Name: N/A  . Number of Children: 5  . Years of Education: N/A   Occupational History  . retired    Social History Main Topics  . Smoking status: Never Smoker   . Smokeless tobacco: Never Used     Comment: Smokes marijuana  . Alcohol Use: 1.2 oz/week    2 Cans of beer per week  . Drug Use: Yes    Special: Marijuana     Comment: marijauana  . Sexual Activity: Not Asked   Other Topics Concern  . None   Social History Narrative   Past Surgical History  Procedure Laterality Date  . Tonsillectomy    . Wisdom tooth extraction     Past Medical History  Diagnosis Date  . Hypertension   . Stroke ([place])   .  Colitis   . Depression   . Arthritis   . Anxiety   . Insomnia, unspecified   . GERD (gastroesophageal reflux disease)   . Trouble swallowing   . Loss of appetite   . Chronic constipation   . Hearing loss   . Headaches due to old head injury   . Colon polyp   . Hyperlipidemia    BP 137/88 mmHg  Pulse 66  SpO2 99%  Opioid Risk Score:   Fall Risk Score:  `1  Depression screen PHQ 2/9  Depression screen [facility] 2/9 02/26/2015 09/21/2013  Decreased Interest 2 3  Down, Depressed, Hopeless 2 2  PHQ - 2 Score 4 5  Altered sleeping 1 3  Tired, decreased energy 2 3  Change in appetite 0 0  Feeling bad or failure about yourself  2 2  Trouble concentrating 1 1  Moving slowly or fidgety/restless 1 1  Suicidal thoughts 0 0  PHQ-9 Score 11 15     Review of Systems  Genitourinary:       Bladder Control Problems  Musculoskeletal:       Spasms  Neurological: Positive for tremors and numbness.  Psychiatric/Behavioral: Positive for confusion. The patient is nervous/anxious.   All other systems reviewed and are negative.  Objective:   Physical Exam  Constitutional: He is oriented to person, place, and time. He appears well-developed and well-nourished.  HENT:  Head: Normocephalic and atraumatic.  Eyes: Conjunctivae and EOM are normal. Pupils are equal, round, and reactive to light.  Neck: Normal range of motion.  Neurological: He is alert and oriented to person, place, and time.  Psychiatric: He has a normal mood and affect.  Nursing note and vitals reviewed.  Ambulates without an assistive device no evidence of toe drag or knee instability. 4/5 on Right side , 5/5 Left side      Assessment & Plan:  1. Right spastic hemiplegia mild with residual right thigh motor deficits following remote CVA. Has some chronic Stroke related neurogenic pain as well as spasticity. Continue baclofen 10 mg 4 times a day Continue tramadol 50 mg 4 times a day Klonopin 0.5 mg twice a day when  necessary Return to clinic in 6 months

## 2015-08-28 ENCOUNTER — Encounter: Payer: Medicare Other | Admitting: Physical Medicine & Rehabilitation

## 2015-09-20 ENCOUNTER — Other Ambulatory Visit: Payer: Self-pay | Admitting: *Deleted

## 2015-09-20 MED ORDER — TRAMADOL HCL 50 MG PO TABS: 50.0000 mg | ORAL_TABLET | Freq: Four times a day (QID) | ORAL | Status: DC | PRN

## 2016-01-17 ENCOUNTER — Other Ambulatory Visit: Payer: Self-pay | Admitting: Physical Medicine & Rehabilitation

## 2016-01-25 ENCOUNTER — Encounter: Payer: Medicare Other | Attending: Physical Medicine & Rehabilitation

## 2016-01-25 ENCOUNTER — Ambulatory Visit: Payer: Medicare Other | Admitting: Physical Medicine & Rehabilitation

## 2016-03-07 ENCOUNTER — Encounter: Payer: Self-pay | Admitting: Physical Medicine & Rehabilitation

## 2016-03-07 ENCOUNTER — Ambulatory Visit (HOSPITAL_BASED_OUTPATIENT_CLINIC_OR_DEPARTMENT_OTHER): Payer: Medicare Other | Admitting: Physical Medicine & Rehabilitation

## 2016-03-07 ENCOUNTER — Encounter: Payer: Medicare Other | Attending: Physical Medicine & Rehabilitation

## 2016-03-07 VITALS — BP 129/82 | HR 62

## 2016-03-07 DIAGNOSIS — G8111 Spastic hemiplegia affecting right dominant side: Secondary | ICD-10-CM | POA: Diagnosis not present

## 2016-03-07 DIAGNOSIS — F329 Major depressive disorder, single episode, unspecified: Secondary | ICD-10-CM | POA: Diagnosis not present

## 2016-03-07 DIAGNOSIS — Z8673 Personal history of transient ischemic attack (TIA), and cerebral infarction without residual deficits: Secondary | ICD-10-CM | POA: Insufficient documentation

## 2016-03-07 DIAGNOSIS — G8110 Spastic hemiplegia affecting unspecified side: Secondary | ICD-10-CM

## 2016-03-07 DIAGNOSIS — G8929 Other chronic pain: Secondary | ICD-10-CM | POA: Diagnosis not present

## 2016-03-07 DIAGNOSIS — G894 Chronic pain syndrome: Secondary | ICD-10-CM | POA: Diagnosis not present

## 2016-03-07 DIAGNOSIS — I10 Essential (primary) hypertension: Secondary | ICD-10-CM | POA: Diagnosis not present

## 2016-03-07 DIAGNOSIS — E785 Hyperlipidemia, unspecified: Secondary | ICD-10-CM | POA: Diagnosis not present

## 2016-03-07 DIAGNOSIS — R251 Tremor, unspecified: Secondary | ICD-10-CM

## 2016-03-07 MED ORDER — DICLOFENAC SODIUM 1 % TD GEL: 2.0000 g | Freq: Four times a day (QID) | TRANSDERMAL | Status: DC

## 2016-03-07 MED ORDER — CLONAZEPAM 0.5 MG PO TABS: 0.5000 mg | ORAL_TABLET | Freq: Two times a day (BID) | ORAL | Status: DC | PRN

## 2016-03-07 MED ORDER — TRAMADOL HCL 50 MG PO TABS: 50.0000 mg | ORAL_TABLET | Freq: Four times a day (QID) | ORAL | Status: DC | PRN

## 2016-03-07 NOTE — Patient Instructions (Signed)
Please call when you need some tramadol
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
## 2016-04-11 ENCOUNTER — Encounter: Payer: Self-pay | Admitting: *Deleted

## 2016-04-11 ENCOUNTER — Telehealth: Payer: Self-pay | Admitting: *Deleted

## 2016-04-11 NOTE — Telephone Encounter (Signed)
Prior auth for Voltaren gel 1% submitted via Cover My Meds to [facility] and received a favorable outcome.

## 2016-05-13 ENCOUNTER — Telehealth: Payer: Self-pay | Admitting: Physical Medicine & Rehabilitation

## 2016-05-13 NOTE — Telephone Encounter (Signed)
John with [facility] called to let us know that the patient had said he dropped off a prescription for Tramadol on or around June 24, but they do not have it.  John wants to make sure that one was given to patient, or was it called or sent electronically.  Please call him at [phone].

## 2016-05-13 NOTE — Telephone Encounter (Signed)
Clarified with John with [facility]. The rx in June has 5 refills. Gave a verbal order for Tramadol 50 mg 5 refills.

## 2016-07-15 ENCOUNTER — Encounter: Payer: Self-pay | Admitting: Physical Medicine & Rehabilitation

## 2016-07-15 ENCOUNTER — Encounter (INDEPENDENT_AMBULATORY_CARE_PROVIDER_SITE_OTHER): Payer: Self-pay

## 2016-07-15 ENCOUNTER — Encounter: Payer: Medicare Other | Attending: Physical Medicine & Rehabilitation

## 2016-07-15 ENCOUNTER — Ambulatory Visit (HOSPITAL_BASED_OUTPATIENT_CLINIC_OR_DEPARTMENT_OTHER): Payer: Medicare Other | Admitting: Physical Medicine & Rehabilitation

## 2016-07-15 VITALS — BP 119/80 | HR 60 | Resp 14

## 2016-07-15 DIAGNOSIS — M25551 Pain in right hip: Secondary | ICD-10-CM | POA: Insufficient documentation

## 2016-07-15 DIAGNOSIS — G8111 Spastic hemiplegia affecting right dominant side: Secondary | ICD-10-CM | POA: Diagnosis not present

## 2016-07-15 MED ORDER — TRAMADOL HCL 50 MG PO TABS: 50.0000 mg | ORAL_TABLET | Freq: Four times a day (QID) | ORAL | 5 refills | Status: DC | PRN

## 2016-07-15 NOTE — Progress Notes (Signed)
Subjective:    Patient ID: Carlos Grimes, male    DOB: 11-13-1945, 70 y.o.   MRN: [id number] CC Right hip pain HPI  Seen by PCP on Friday, received depomedrol He is a lot better. He is wondering how long this might last. No injury hx to Right hip, has fallen in past  No numbness and tingling Worsened symptoms with walking No significant back pain.  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 General activity 1 Relation with others 0 Enjoyment of life 1 What TIME of day is your pain at its worst? evening Sleep (in general) Good  Pain is worse with: standing Pain improves with: medication Relief from Meds: 7  Mobility walk without assistance ability to climb steps?  yes do you drive?  yes Do you have any goals in this area?  no  Function retired I need assistance with the following:  shopping Do you have any goals in this area?  no  Neuro/Psych trouble walking anxiety  Prior Studies Any changes since last visit?  no  Physicians involved in your care Any changes since last visit?  no   Family History  Problem Relation Age of Onset  . Cancer Father   . Heart failure Father   . Diabetes Maternal Grandmother    Social History   Social History  . Marital status: Married    Spouse name: N/A  . Number of children: 5  . Years of education: N/A   Occupational History  . retired    Social History Main Topics  . Smoking status: Never Smoker  . Smokeless tobacco: Never Used     Comment: Smokes marijuana  . Alcohol use 1.2 oz/week    2 Cans of beer per week  . Drug use:     Types: Marijuana     Comment: marijauana  . Sexual activity: Not Asked   Other Topics Concern  . None   Social History Narrative  . None   Past Surgical History:  Procedure Laterality Date  .  TONSILLECTOMY    . WISDOM TOOTH EXTRACTION     Past Medical History:  Diagnosis Date  . Anxiety   . Arthritis   . Chronic constipation   . Colitis   . Colon polyp   . Depression   . GERD (gastroesophageal reflux disease)   . Headaches due to old head injury   . Hearing loss   . Hyperlipidemia   . Hypertension   . Insomnia, unspecified   . Loss of appetite   . Stroke ([place])   . Trouble swallowing    BP 119/80   Pulse 60   Resp 14   SpO2 96%   Opioid Risk Score:   Fall Risk Score:  `1  Depression screen PHQ 2/9  Depression screen [facility] 2/9 02/26/2015 09/21/2013  Decreased Interest 2 3  Down, Depressed, Hopeless 2 2  PHQ - 2 Score 4 5  Altered sleeping 1 3  Tired, decreased energy 2 3  Change in appetite 0 0  Feeling bad or failure about yourself  2 2  Trouble concentrating 1 1  Moving slowly or fidgety/restless 1 1  Suicidal thoughts 0 0  PHQ-9 Score 11 15     Review of Systems  Gastrointestinal: Positive for abdominal pain.  All other systems reviewed and are negative.  Objective:   Physical Exam  Constitutional: He is oriented to person, place, and time. He appears well-developed and well-nourished.  HENT:  Head: Normocephalic and atraumatic.  Eyes: Conjunctivae and EOM are normal. Pupils are equal, round, and reactive to light.  Musculoskeletal:       Right hip: He exhibits decreased range of motion and decreased strength. He exhibits no tenderness and no deformity.       Left hip: Normal.       Right knee: Normal.       Left knee: Normal.  Mild hip pain with Faber's Decreased right hip internal rotation Normal left hip range of motion  Mild tenderness over left PSIS, not right Lumbar spine tenderness  Neurological: He is alert and oriented to person, place, and time. Gait normal.  Reflex Scores:      Patellar reflexes are 3+ on the right side and 1+ on the left side. Motor strength is 4 plus in the right hip flexor, knee extensor, ankle  dorsiflexor 5/5 on left at the hip flexor and extensor, ankle dorsiflexors      Psychiatric: He has a normal mood and affect.  Nursing note and vitals reviewed.         Assessment & Plan:  1. Acute right hip pain improved after Depo-Medrol. Examination significant for decreased range of motion. I do not suspect trauma in this case, but the decreased range of motion with internal rotation suggests osteoarthritis. We discussed that other conditions may mimic this, such as sciatic pain. However, I do not suspect this given the lack of and no localizing neurologic signs  We discussed it is difficult to predict how long the Depo-Medrol injection should help. Also, he could not tell me of any aggravating activities prior to the onset of pain, but has been generally more active since his daughter has been visiting him. We'll check hip x-rays Have gone over generic hip exercises  Return to clinic in 4 weeks or sooner if pain recurs \\We  discussed that patient may require intra-articular hip injection under ultrasound guidance

## 2016-07-15 NOTE — Patient Instructions (Signed)
Generic Hip Exercises RANGE OF MOTION (ROM) AND STRETCHING EXERCISES  These exercises may help you when beginning to rehabilitate your injury. Doing them too aggressively can worsen your condition. Complete them slowly and gently. Your symptoms may resolve with or without further involvement from your physician, physical therapist or athletic trainer. While completing these exercises, remember:   Restoring tissue flexibility helps normal motion to return to the joints. This allows healthier, less painful movement and activity.  An effective stretch should be held for at least 30 seconds.  A stretch should never be painful. You should only feel a gentle lengthening or release in the stretched tissue. If these stretches worsen your symptoms even when done gently, consult your physician, physical therapist or athletic trainer. STRETCH - Hamstrings, Supine   Lie on your back. Loop a belt or towel over the ball of your right / left foot.  Straighten your right / left knee and slowly pull on the belt to raise your leg. Do not allow the right / left knee to bend. Keep your opposite leg flat on the floor.  Raise the leg until you feel a gentle stretch behind your right / left knee or thigh. Hold this position for __________ seconds. Repeat __________ times. Complete this stretch __________ times per day.  STRETCH - Hip Rotators   Lie on your back on a firm surface. Grasp your right / left knee with your right / left hand and your ankle with your opposite hand.  Keeping your hips and shoulders firmly planted, gently pull your right / left knee and rotate your lower leg toward your opposite shoulder until you feel a stretch in your buttocks.  Hold this stretch for __________ seconds. Repeat this stretch __________ times. Complete this stretch __________ times per day. STRETCH - Hamstrings/Adductors, V-Sit   Sit on the floor with your legs extended in a large "V," keeping your knees straight.  With  your head and chest upright, bend at your waist reaching for your right foot to stretch your left adductors.  You should feel a stretch in your left inner thigh. Hold for __________ seconds.  Return to the upright position to relax your leg muscles.  Continuing to keep your chest upright, bend straight forward at your waist to stretch your hamstrings.  You should feel a stretch behind both of your thighs and/or knees. Hold for __________ seconds.  Return to the upright position to relax your leg muscles.  Repeat steps 2 through 4 for opposite leg. Repeat __________ times. Complete this exercise __________ times per day.  STRETCHING - Hip Flexors, Lunge  Half kneel with your right / left knee on the floor and your opposite knee bent and directly over your ankle.  Keep good posture with your head over your shoulders. Tighten your buttocks to point your tailbone downward; this will prevent your back from arching too much.  You should feel a gentle stretch in the front of your thigh and/or hip. If you do not feel any resistance, slightly slide your opposite foot forward and then slowly lunge forward so your knee once again lines up over your ankle. Be sure your tailbone remains pointed downward.  Hold this stretch for __________ seconds. Repeat __________ times. Complete this stretch __________ times per day. STRENGTHENING EXERCISES These exercises may help you when beginning to rehabilitate your injury. They may resolve your symptoms with or without further involvement from your physician, physical therapist or athletic trainer. While completing these exercises, remember:     Muscles can gain both the endurance and the strength needed for everyday activities through controlled exercises.  Complete these exercises as instructed by your physician, physical therapist or athletic trainer. Progress the resistance and repetitions only as guided.  You may experience muscle soreness or fatigue, but  the pain or discomfort you are trying to eliminate should never worsen during these exercises. If this pain does worsen, stop and make certain you are following the directions exactly. If the pain is still present after adjustments, discontinue the exercise until you can discuss the trouble with your clinician. STRENGTH - Hip Extensors, Bridge   Lie on your back on a firm surface. Bend your knees and place your feet flat on the floor.  Tighten your buttocks muscles and lift your bottom off the floor until your trunk is level with your thighs. You should feel the muscles in your buttocks and back of your thighs working. If you do not feel these muscles, slide your feet 1-2 inches further away from your buttocks.  Hold this position for __________ seconds.  Slowly lower your hips to the starting position and allow your buttock muscles relax completely before beginning the next repetition.  If this exercise is too easy, you may cross your arms over your chest. Repeat __________ times. Complete this exercise __________ times per day.  STRENGTH - Hip Abductors, Straight Leg Raises  Be aware of your form throughout the entire exercise so that you exercise the correct muscles. Sloppy form means that you are not strengthening the correct muscles.  Lie on your side so that your head, shoulders, knee and hip line up. You may bend your lower knee to help maintain your balance. Your right / left leg should be on top.  Roll your hips slightly forward, so that your hips are stacked directly over each other and your right / left knee is facing forward.  Lift your top leg up 4-6 inches, leading with your heel. Be sure that your foot does not drift forward or that your knee does not roll toward the ceiling.  Hold this position for __________ seconds. You should feel the muscles in your outer hip lifting (you may not notice this until your leg begins to tire).  Slowly lower your leg to the starting position.  Allow the muscles to fully relax before beginning the next repetition. Repeat __________ times. Complete this exercise __________ times per day.  STRENGTH - Hip Adductors, Straight Leg Raises   Lie on your side so that your head, shoulders, knee and hip line up. You may place your upper foot in front to help maintain your balance. Your right / left leg should be on the bottom.  Roll your hips slightly forward, so that your hips are stacked directly over each other and your right / left knee is facing forward.  Tense the muscles in your inner thigh and lift your bottom leg 4-6 inches. Hold this position for __________ seconds.  Slowly lower your leg to the starting position. Allow the muscles to fully relax before beginning the next repetition. Repeat __________ times. Complete this exercise __________ times per day.  STRENGTH - Quadriceps, Straight Leg Raises  Quality counts! Watch for signs that the quadriceps muscle is working to insure you are strengthening the correct muscles and not "cheating" by substituting with healthier muscles.  Lay on your back with your right / left leg extended and your opposite knee bent.  Tense the muscles in the front of your right /   left thigh. You should see either your knee cap slide up or increased dimpling just above the knee. Your thigh may even quiver.  Tighten these muscles even more and raise your leg 4 to 6 inches off the floor. Hold for right / left seconds.  Keeping these muscles tense, lower your leg.  Relax the muscles slowly and completely in between each repetition. Repeat __________ times. Complete this exercise __________ times per day.  STRENGTH - Hip Abductors, Standing  Tie one end of a rubber exercise band/tubing to a secure surface (table, pole) and tie a loop at the other end.  Place the loop around your right / left ankle. Keeping your ankle with the band directly opposite of the secured end, step away until there is tension in  the tube/band.  Hold onto a chair as needed for balance.  Keeping your back upright, your shoulders over your hips, and your toes pointing forward, lift your right / left leg out to your side. Be sure to lift your leg with your hip muscles. Do not "throw" your leg or tip your body to lift your leg.  Slowly and with control, return to the starting position. Repeat exercise __________ times. Complete this exercise __________ times per day.  STRENGTH - Quadriceps, Squats  Stand in a door frame so that your feet and knees are in line with the frame.  Use your hands for balance, not support, on the frame.  Slowly lower your weight, bending at the hips and knees. Keep your lower legs upright so that they are parallel with the door frame. Squat only within the range that does not increase your knee pain. Never let your hips drop below your knees.  Slowly return upright, pushing with your legs, not pulling with your hands.   This information is not intended to replace advice given to you by your health care provider. Make sure you discuss any questions you have with your health care provider.   Document Released: 09/19/2005 Document Revised: 09/22/2014 Document Reviewed: 12/14/2008 Elsevier Interactive Patient Education 2016 [employer]  

## 2016-07-17 ENCOUNTER — Ambulatory Visit (INDEPENDENT_AMBULATORY_CARE_PROVIDER_SITE_OTHER): Payer: Medicare Other

## 2016-07-17 DIAGNOSIS — M25551 Pain in right hip: Secondary | ICD-10-CM

## 2016-07-17 DIAGNOSIS — M25552 Pain in left hip: Secondary | ICD-10-CM

## 2016-07-17 DIAGNOSIS — G8929 Other chronic pain: Secondary | ICD-10-CM

## 2016-08-12 ENCOUNTER — Encounter: Payer: Medicare Other | Attending: Physical Medicine & Rehabilitation

## 2016-08-12 ENCOUNTER — Encounter: Payer: Self-pay | Admitting: Physical Medicine & Rehabilitation

## 2016-08-12 ENCOUNTER — Ambulatory Visit (HOSPITAL_BASED_OUTPATIENT_CLINIC_OR_DEPARTMENT_OTHER): Payer: Medicare Other | Admitting: Physical Medicine & Rehabilitation

## 2016-08-12 DIAGNOSIS — M25551 Pain in right hip: Secondary | ICD-10-CM | POA: Insufficient documentation

## 2016-08-12 DIAGNOSIS — R251 Tremor, unspecified: Secondary | ICD-10-CM | POA: Diagnosis not present

## 2016-08-12 MED ORDER — CLONAZEPAM 0.5 MG PO TABS: 0.5000 mg | ORAL_TABLET | Freq: Two times a day (BID) | ORAL | 1 refills | Status: DC | PRN

## 2016-08-12 NOTE — Patient Instructions (Signed)
Please call to schedule Left hip injection

## 2016-08-12 NOTE — Progress Notes (Signed)
Subjective:    Patient ID: Carlos Grimes, male    DOB: 1945-11-23, 70 y.o.   MRN: [id number]  HPI  70 year old male with history of left CVA causing right hemiparesis as well as right central pain syndrome. Patient has also developed some increasing pain in the hips. X-rays showed mild degenerative changes bilaterally, but also weakening of the femoral neck on the left side. Patient felt that his left hip has been hurting him more than the right hip. Recently, although last month. The right hip was more painful. He denies any falls or other trauma to the area. No numbness or tingling shooting down into the legs. No new pain issues Cont to have RIght hip pain  Pain Inventory Average Pain 2 Pain Right Now 2 My pain is sharp  In the last 24 hours, has pain interfered with the following? General activity 7 Relation with others 7 Enjoyment of life 7 What TIME of day is your pain at its worst? morning Sleep (in general) NA  Pain is worse with: walking and standing Pain improves with: rest and therapy/exercise Relief from Meds: 5  Mobility walk without assistance how many minutes can you walk? 30 ability to climb steps?  yes do you drive?  yes  Function retired I need assistance with the following:  meal prep, household duties and shopping  Neuro/Psych tremor tingling trouble walking spasms anxiety  Prior Studies Any changes since last visit?  no  Physicians involved in your care Any changes since last visit?  no   Family History  Problem Relation Age of Onset  . Cancer Father   . Heart failure Father   . Diabetes Maternal Grandmother    Social History   Social History  . Marital status: Married    Spouse name: N/A  . Number of children: 5  . Years of education: N/A   Occupational History  . retired    Social History Main Topics  . Smoking status: Never Smoker  . Smokeless tobacco: Never Used     Comment: Smokes marijuana  . Alcohol use 1.2 oz/week     2 Cans of beer per week  . Drug use:     Types: Marijuana     Comment: marijauana  . Sexual activity: Not Asked   Other Topics Concern  . None   Social History Narrative  . None   Past Surgical History:  Procedure Laterality Date  . TONSILLECTOMY    . WISDOM TOOTH EXTRACTION     Past Medical History:  Diagnosis Date  . Anxiety   . Arthritis   . Chronic constipation   . Colitis   . Colon polyp   . Depression   . GERD (gastroesophageal reflux disease)   . Headaches due to old head injury   . Hearing loss   . Hyperlipidemia   . Hypertension   . Insomnia, unspecified   . Loss of appetite   . Stroke ([place])   . Trouble swallowing    BP (!) 148/85   Pulse 63   Resp 14   SpO2 96%   Opioid Risk Score:   Fall Risk Score:  `1  Depression screen PHQ 2/9  Depression screen [facility] 2/9 08/12/2016 02/26/2015 09/21/2013  Decreased Interest 2 2 3   Down, Depressed, Hopeless 2 2 2   PHQ - 2 Score 4 4 5   Altered sleeping - 1 3  Tired, decreased energy - 2 3  Change in appetite - 0 0  Feeling bad or failure  about yourself  - 2 2  Trouble concentrating - 1 1  Moving slowly or fidgety/restless - 1 1  Suicidal thoughts - 0 0  PHQ-9 Score - 11 15    Review of Systems  Constitutional: Negative.   HENT: Negative.   Eyes: Negative.   Respiratory: Negative.   Cardiovascular: Negative.   Gastrointestinal: Negative.   Endocrine: Negative.   Musculoskeletal: Negative.   Skin: Negative.   Allergic/Immunologic: Negative.   Neurological: Negative.   Hematological: Negative.   Psychiatric/Behavioral: Negative.   All other systems reviewed and are negative.      Objective:   Physical Exam  Constitutional: He is oriented to person, place, and time. He appears well-developed and well-nourished.  HENT:  Head: Normocephalic and atraumatic.  Eyes: Conjunctivae and EOM are normal. Pupils are equal, round, and reactive to light.  Neurological: He is alert and oriented to person,  place, and time.  Psychiatric: He has a normal mood and affect. His behavior is normal. Judgment and thought content normal.  Nursing note and vitals reviewed.  Patient has pain in the left groin with internal/external rotation of the left hip. There is mild endrange limitation with internal and external rotation. Right hip has no tenderness with interelectrode rotation. Faber's negative. Negative straight leg raising. Motor strength is 4 plus in the right deltoid, biceps, triceps, grip 5/5 left deltoid, biceps, triceps, grip       Assessment & Plan:  1. 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 Continue tramadol 50 mg 4 times a day Continue Klonopin point 5 twice a day  2. Spasticity related to CVA, continued baclofen 10 g 4 times a day  3. Left greater than right hip pain. May have some femoral acetabular impingement on left side, given his anatomy. We discussed hip injection under fluoroscopic guidance. He would like to hold off for now. If he changes his mind, he will call to schedule

## 2016-08-29 ENCOUNTER — Other Ambulatory Visit: Payer: Self-pay

## 2016-08-29 MED ORDER — BACLOFEN 10 MG PO TABS: 10.0000 mg | ORAL_TABLET | Freq: Four times a day (QID) | ORAL | 1 refills | Status: DC

## 2016-09-05 ENCOUNTER — Ambulatory Visit: Payer: Medicare Other | Admitting: Physical Medicine & Rehabilitation

## 2017-01-19 ENCOUNTER — Encounter: Payer: Self-pay | Admitting: Physical Medicine & Rehabilitation

## 2017-01-19 ENCOUNTER — Encounter: Payer: Medicare Other | Attending: Physical Medicine & Rehabilitation

## 2017-01-19 ENCOUNTER — Ambulatory Visit (HOSPITAL_BASED_OUTPATIENT_CLINIC_OR_DEPARTMENT_OTHER): Payer: Medicare Other | Admitting: Physical Medicine & Rehabilitation

## 2017-01-19 VITALS — BP 137/85 | HR 74

## 2017-01-19 DIAGNOSIS — R52 Pain, unspecified: Secondary | ICD-10-CM | POA: Insufficient documentation

## 2017-01-19 DIAGNOSIS — I10 Essential (primary) hypertension: Secondary | ICD-10-CM | POA: Insufficient documentation

## 2017-01-19 DIAGNOSIS — G4700 Insomnia, unspecified: Secondary | ICD-10-CM | POA: Insufficient documentation

## 2017-01-19 DIAGNOSIS — Z79899 Other long term (current) drug therapy: Secondary | ICD-10-CM | POA: Insufficient documentation

## 2017-01-19 DIAGNOSIS — F329 Major depressive disorder, single episode, unspecified: Secondary | ICD-10-CM | POA: Insufficient documentation

## 2017-01-19 DIAGNOSIS — R251 Tremor, unspecified: Secondary | ICD-10-CM | POA: Diagnosis not present

## 2017-01-19 DIAGNOSIS — G890 Central pain syndrome: Secondary | ICD-10-CM | POA: Diagnosis not present

## 2017-01-19 DIAGNOSIS — H9190 Unspecified hearing loss, unspecified ear: Secondary | ICD-10-CM | POA: Insufficient documentation

## 2017-01-19 DIAGNOSIS — I69351 Hemiplegia and hemiparesis following cerebral infarction affecting right dominant side: Secondary | ICD-10-CM | POA: Diagnosis not present

## 2017-01-19 DIAGNOSIS — E785 Hyperlipidemia, unspecified: Secondary | ICD-10-CM | POA: Insufficient documentation

## 2017-01-19 DIAGNOSIS — Z8249 Family history of ischemic heart disease and other diseases of the circulatory system: Secondary | ICD-10-CM | POA: Diagnosis not present

## 2017-01-19 DIAGNOSIS — F419 Anxiety disorder, unspecified: Secondary | ICD-10-CM | POA: Insufficient documentation

## 2017-01-19 DIAGNOSIS — M1990 Unspecified osteoarthritis, unspecified site: Secondary | ICD-10-CM | POA: Insufficient documentation

## 2017-01-19 DIAGNOSIS — G8111 Spastic hemiplegia affecting right dominant side: Secondary | ICD-10-CM | POA: Diagnosis not present

## 2017-01-19 MED ORDER — DICLOFENAC SODIUM 1 % TD GEL: 2.0000 g | Freq: Four times a day (QID) | TRANSDERMAL | 3 refills | Status: DC

## 2017-01-19 MED ORDER — CLONAZEPAM 0.5 MG PO TABS: 0.5000 mg | ORAL_TABLET | Freq: Two times a day (BID) | ORAL | 1 refills | Status: DC | PRN

## 2017-01-19 NOTE — Progress Notes (Signed)
Subjective:    Patient ID: Carlos Grimes, male    DOB: 1946-04-04, 71 y.o.   MRN: [id number] 71 year old male with history of left CVA and a thalamic pain syndrome affecting right upper, right lower limb HPI Tramadol 50mg  twice a day, wants to come off of this, He states it makes him feel funny.   Baclofen 10mg  QID Klonopin .5mg   Po BID   He remains functionally independent with all his self-care and mobility Pain Inventory Average Pain 2 Pain Right Now 2 My pain is .  In the last 24 hours, has pain interfered with the following? General activity 5 Relation with others 4 Enjoyment of life 8 What TIME of day is your pain at its worst? night Sleep (in general) Fair  Pain is worse with: walking, standing and some activites Pain improves with: rest Relief from Meds: 2  Mobility walk without assistance ability to climb steps?  yes do you drive?  yes  Function disabled: date disabled 2008  Neuro/Psych bladder control problems weakness tremor tingling trouble walking spasms confusion depression anxiety  Prior Studies Any changes since last visit?  no  Physicians involved in your care Any changes since last visit?  no   Family History  Problem Relation Age of Onset  . Cancer Father   . Heart failure Father   . Diabetes Maternal Grandmother    Social History   Social History  . Marital status: Married    Spouse name: N/A  . Number of children: 5  . Years of education: N/A   Occupational History  . retired    Social History Main Topics  . Smoking status: Never Smoker  . Smokeless tobacco: Never Used     Comment: Smokes marijuana  . Alcohol use 1.2 oz/week    2 Cans of beer per week  . Drug use: Yes    Types: Marijuana     Comment: marijauana  . Sexual activity: Not on file   Other Topics Concern  . Not on file   Social History Narrative  . No narrative on file   Past Surgical History:  Procedure Laterality Date  . TONSILLECTOMY      . WISDOM TOOTH EXTRACTION     Past Medical History:  Diagnosis Date  . Anxiety   . Arthritis   . Chronic constipation   . Colitis   . Colon polyp   . Depression   . GERD (gastroesophageal reflux disease)   . Headaches due to old head injury   . Hearing loss   . Hyperlipidemia   . Hypertension   . Insomnia, unspecified   . Loss of appetite   . Stroke ([place])   . Trouble swallowing    There were no vitals taken for this visit.  Opioid Risk Score:   Fall Risk Score:  `1  Depression screen PHQ 2/9  Depression screen [facility] 2/9 08/12/2016 02/26/2015 09/21/2013  Decreased Interest 2 2 3   Down, Depressed, Hopeless 2 2 2   PHQ - 2 Score 4 4 5   Altered sleeping - 1 3  Tired, decreased energy - 2 3  Change in appetite - 0 0  Feeling bad or failure about yourself  - 2 2  Trouble concentrating - 1 1  Moving slowly or fidgety/restless - 1 1  Suicidal thoughts - 0 0  PHQ-9 Score - 11 15    Review of Systems  Constitutional: Negative.   HENT: Negative.   Eyes: Negative.   Respiratory: Negative.  Cardiovascular: Negative.   Gastrointestinal: Negative.   Endocrine: Negative.   Genitourinary: Positive for difficulty urinating.  Musculoskeletal: Negative.   Skin: Negative.   Allergic/Immunologic: Negative.   Neurological: Negative.   Hematological: Negative.   Psychiatric/Behavioral: Negative.   All other systems reviewed and are negative.      Objective:   Physical Exam  Constitutional: He is oriented to person, place, and time. He appears well-developed and well-nourished.  Neurological: He is alert and oriented to person, place, and time.  Skin: Skin is warm and dry.  Psychiatric: He has a normal mood and affect.  Nursing note and vitals reviewed.  Patient has hyperactive reflexes on the right side. His motor strength is 4/5 in the right deltoid, biceps, triceps, grip, hip flexor, knee extensor, ankle dorsiflexor 5/5 in the left deltoid, bicep, tricep, grip, hip  flexor, knee extensor, ankle dorsi flexor. Gait is without evidence of toe drag or knee instability.       Assessment & Plan:  1.  Right spastic hemi s/p CVA  Baclofen 10mg  BID, May take up to 4 times a day as needed Klonopin .5mg  BID  2.  Post stroke central pain Tramadol to 25mg  in the morning and 50mg  at night , he can wean down further after a month to 25 twice a day if his pain is controlled

## 2017-02-18 ENCOUNTER — Other Ambulatory Visit: Payer: Self-pay | Admitting: Physical Medicine & Rehabilitation

## 2017-03-09 ENCOUNTER — Ambulatory Visit (HOSPITAL_BASED_OUTPATIENT_CLINIC_OR_DEPARTMENT_OTHER): Payer: Medicare Other | Admitting: Physical Medicine & Rehabilitation

## 2017-03-09 ENCOUNTER — Encounter: Payer: Medicare Other | Attending: Physical Medicine & Rehabilitation

## 2017-03-09 ENCOUNTER — Encounter: Payer: Self-pay | Admitting: Physical Medicine & Rehabilitation

## 2017-03-09 VITALS — BP 148/84 | HR 63 | Resp 14

## 2017-03-09 DIAGNOSIS — F329 Major depressive disorder, single episode, unspecified: Secondary | ICD-10-CM | POA: Diagnosis not present

## 2017-03-09 DIAGNOSIS — R52 Pain, unspecified: Secondary | ICD-10-CM | POA: Diagnosis not present

## 2017-03-09 DIAGNOSIS — H9190 Unspecified hearing loss, unspecified ear: Secondary | ICD-10-CM | POA: Insufficient documentation

## 2017-03-09 DIAGNOSIS — Z79899 Other long term (current) drug therapy: Secondary | ICD-10-CM | POA: Diagnosis not present

## 2017-03-09 DIAGNOSIS — I69351 Hemiplegia and hemiparesis following cerebral infarction affecting right dominant side: Secondary | ICD-10-CM | POA: Insufficient documentation

## 2017-03-09 DIAGNOSIS — G8110 Spastic hemiplegia affecting unspecified side: Secondary | ICD-10-CM

## 2017-03-09 DIAGNOSIS — G4700 Insomnia, unspecified: Secondary | ICD-10-CM | POA: Diagnosis not present

## 2017-03-09 DIAGNOSIS — Z8249 Family history of ischemic heart disease and other diseases of the circulatory system: Secondary | ICD-10-CM | POA: Diagnosis not present

## 2017-03-09 DIAGNOSIS — M1990 Unspecified osteoarthritis, unspecified site: Secondary | ICD-10-CM | POA: Diagnosis not present

## 2017-03-09 DIAGNOSIS — F419 Anxiety disorder, unspecified: Secondary | ICD-10-CM | POA: Insufficient documentation

## 2017-03-09 DIAGNOSIS — G890 Central pain syndrome: Secondary | ICD-10-CM

## 2017-03-09 DIAGNOSIS — I10 Essential (primary) hypertension: Secondary | ICD-10-CM | POA: Insufficient documentation

## 2017-03-09 DIAGNOSIS — E785 Hyperlipidemia, unspecified: Secondary | ICD-10-CM | POA: Insufficient documentation

## 2017-03-09 DIAGNOSIS — R251 Tremor, unspecified: Secondary | ICD-10-CM

## 2017-03-09 MED ORDER — BACLOFEN 10 MG PO TABS: 10.0000 mg | ORAL_TABLET | Freq: Four times a day (QID) | ORAL | 1 refills | Status: DC

## 2017-03-09 MED ORDER — TRAMADOL HCL 50 MG PO TABS: ORAL_TABLET | ORAL | 5 refills | Status: DC

## 2017-03-09 MED ORDER — CLONAZEPAM 0.5 MG PO TABS: 0.5000 mg | ORAL_TABLET | Freq: Two times a day (BID) | ORAL | 5 refills | Status: DC | PRN

## 2017-03-09 NOTE — Progress Notes (Signed)
Subjective:    Patient ID: Carlos Grimes, male    DOB: 1946-08-11, 71 y.o.   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 He has a residual Mild right hemiparesis. He is independent with all his self-care and mobility. He has occasional right shoulder pain. He has occasional left hip pain. This tends to flareup and get better over the course of a couple days. We reviewed his hip x-ray results from last November. He does have thickened femoral neck on the left side which could predispose him to impingement syndrome.   Pain Inventory Average Pain 2 Pain Right Now 4 My pain is sharp, dull, tingling and aching  In the last 24 hours, has pain interfered with the following? General activity 2 Relation with others 2 Enjoyment of life 2 What TIME of day is your pain at its worst? evening Sleep (in general) Fair  Pain is worse with: walking Pain improves with: rest and medication Relief from Meds: 8  Mobility walk without assistance how many minutes can you walk? 30 ability to climb steps?  yes  Function retired I need assistance with the following:  meal prep, household duties and shopping Do you have any goals in this area?  no  Neuro/Psych weakness numbness tremor tingling trouble walking spasms dizziness  Prior Studies Any changes since last visit?  no  Physicians involved in your care Any changes since last visit?  no   Family History  Problem Relation Age of Onset  . Cancer Father   . Heart failure Father   . Diabetes Maternal Grandmother    Social History   Social History  . Marital status: Married    Spouse name: N/A  . Number of children: 5  . Years of education: N/A   Occupational History  . retired    Social History Main Topics  . Smoking status: Never Smoker  . Smokeless tobacco: Never Used     Comment: Smokes marijuana  . Alcohol use 1.2 oz/week    2 Cans of beer per week  .  Drug use: Yes    Types: Marijuana     Comment: marijauana  . Sexual activity: Not Asked   Other Topics Concern  . None   Social History Narrative  . None   Past Surgical History:  Procedure Laterality Date  . TONSILLECTOMY    . WISDOM TOOTH EXTRACTION     Past Medical History:  Diagnosis Date  . Anxiety   . Arthritis   . Chronic constipation   . Colitis   . Colon polyp   . Depression   . GERD (gastroesophageal reflux disease)   . Headaches due to old head injury   . Hearing loss   . Hyperlipidemia   . Hypertension   . Insomnia, unspecified   . Loss of appetite   . Stroke ([place])   . Trouble swallowing    There were no vitals taken for this visit.  Opioid Risk Score:   Fall Risk Score:  `1  Depression screen PHQ 2/9  Depression screen [facility] 2/9 08/12/2016 02/26/2015 09/21/2013  Decreased Interest 2 2 3   Down, Depressed, Hopeless 2 2 2   PHQ - 2 Score 4 4 5   Altered sleeping - 1 3  Tired, decreased energy - 2 3  Change in appetite - 0 0  Feeling bad or failure about yourself  - 2 2  Trouble concentrating - 1 1  Moving slowly  or fidgety/restless - 1 1  Suicidal thoughts - 0 0  PHQ-9 Score - 11 15    Review of Systems  HENT: Negative.   Eyes: Negative.   Respiratory: Negative.   Cardiovascular: Negative.   Gastrointestinal: Negative.   Endocrine: Negative.   Genitourinary: Negative.   Musculoskeletal: Positive for arthralgias and gait problem.       Spasms   Skin: Negative.   Allergic/Immunologic: Negative.   Neurological: Positive for dizziness, tremors, weakness, numbness and headaches.       Tingling  Hematological: Negative.   Psychiatric/Behavioral: Negative.   All other systems reviewed and are negative.      Objective:   Physical Exam  Constitutional: He is oriented to person, place, and time. He appears well-developed and well-nourished.  HENT:  Head: Normocephalic and atraumatic.  Eyes: Conjunctivae and EOM are normal. Pupils are equal,  round, and reactive to light.  Neck: Normal range of motion.  Neurological: He is alert and oriented to person, place, and time.  4/5 in the right deltoid, biceps, triceps, grip, hip flexor, knee extensor, ankle dorsiflexor and plantar flexor. No pain with shoulder abduction on the right side. Gait is without evidence of toe drag or knee instability, he does have a stiff legged gait on the right side  Psychiatric: He has a normal mood and affect.  Nursing note and vitals reviewed.         Assessment & Plan:  1. Central post stroke pain syndrome, attempted weaning of tramadol last month. However, he had increase in his pain. We will resume prior dose of tramadol 50 mg 3 times a day  2. Spasticity related CVA, continue baclofen and Klonopin

## 2017-03-24 ENCOUNTER — Other Ambulatory Visit: Payer: Self-pay | Admitting: *Deleted

## 2017-03-24 MED ORDER — TRAMADOL HCL 50 MG PO TABS: 50.0000 mg | ORAL_TABLET | Freq: Three times a day (TID) | ORAL | 5 refills | Status: DC | PRN

## 2017-07-23 ENCOUNTER — Ambulatory Visit: Payer: Medicare Other | Admitting: Physical Medicine & Rehabilitation

## 2017-07-23 DIAGNOSIS — I639 Cerebral infarction, unspecified: Secondary | ICD-10-CM | POA: Insufficient documentation

## 2017-07-23 DIAGNOSIS — G894 Chronic pain syndrome: Secondary | ICD-10-CM | POA: Insufficient documentation

## 2017-07-23 DIAGNOSIS — G8111 Spastic hemiplegia affecting right dominant side: Secondary | ICD-10-CM | POA: Insufficient documentation

## 2017-08-11 ENCOUNTER — Ambulatory Visit: Payer: Medicare Other | Admitting: Physical Medicine & Rehabilitation

## 2017-08-11 ENCOUNTER — Encounter: Payer: Medicare Other | Attending: Physical Medicine & Rehabilitation

## 2017-08-11 ENCOUNTER — Encounter: Payer: Self-pay | Admitting: Physical Medicine & Rehabilitation

## 2017-08-11 VITALS — BP 156/81 | HR 61 | Resp 14

## 2017-08-11 DIAGNOSIS — G8110 Spastic hemiplegia affecting unspecified side: Secondary | ICD-10-CM | POA: Diagnosis not present

## 2017-08-11 DIAGNOSIS — I639 Cerebral infarction, unspecified: Secondary | ICD-10-CM | POA: Diagnosis not present

## 2017-08-11 DIAGNOSIS — G890 Central pain syndrome: Secondary | ICD-10-CM

## 2017-08-11 DIAGNOSIS — G894 Chronic pain syndrome: Secondary | ICD-10-CM | POA: Diagnosis not present

## 2017-08-11 DIAGNOSIS — R251 Tremor, unspecified: Secondary | ICD-10-CM | POA: Diagnosis not present

## 2017-08-11 DIAGNOSIS — G8111 Spastic hemiplegia affecting right dominant side: Secondary | ICD-10-CM | POA: Diagnosis not present

## 2017-08-11 MED ORDER — CLONAZEPAM 0.5 MG PO TABS: 0.5000 mg | ORAL_TABLET | Freq: Two times a day (BID) | ORAL | 5 refills | Status: DC | PRN

## 2017-08-11 MED ORDER — TRAMADOL HCL 50 MG PO TABS: 50.0000 mg | ORAL_TABLET | Freq: Three times a day (TID) | ORAL | 5 refills | Status: DC | PRN

## 2017-08-11 NOTE — Progress Notes (Signed)
Subjective:    Patient ID: Carlos Grimes, male    DOB: 1946/04/14, 71 y.o.   MRN: [id number]  HPI Episode of vertigo 2 wks ago, no fall Has had dizziness in the past. Prior history of CVA with right spastic hemiparesis, continues to have some hypersensitivity pain right upper extremity more in the shoulder than in the hand.  No facial pain no significant lower extremity pain.  Pain Inventory Average Pain 2 Pain Right Now 4 My pain is sharp, burning, dull, stabbing, tingling and aching  In the last 24 hours, has pain interfered with the following? General activity 4 Relation with others 1 Enjoyment of life 7 What TIME of day is your pain at its worst? n/a Sleep (in general) Fair  Pain is worse with: walking Pain improves with: medication Relief from Meds: 4  Mobility walk without assistance ability to climb steps?  yes do you drive?  yes  Function retired I need assistance with the following:  meal prep and shopping Do you have any goals in this area?  no  Neuro/Psych bladder control problems weakness numbness tremor trouble walking spasms dizziness confusion  Prior Studies Any changes since last visit?  no  Physicians involved in your care Any changes since last visit?  no   Family History  Problem Relation Age of Onset  . Cancer Father   . Heart failure Father   . Diabetes Maternal Grandmother    Social History   Socioeconomic History  . Marital status: Married    Spouse name: None  . Number of children: 5  . Years of education: None  . Highest education level: None  Social Needs  . Financial resource strain: None  . Food insecurity - worry: None  . Food insecurity - inability: None  . Transportation needs - medical: None  . Transportation needs - non-medical: None  Occupational History  . Occupation: retired  Tobacco Use  . Smoking status: Never Smoker  . Smokeless tobacco: Never Used  . Tobacco comment: Smokes marijuana  Substance  and Sexual Activity  . Alcohol use: Yes    Alcohol/week: 1.2 oz    Types: 2 Cans of beer per week  . Drug use: Yes    Types: Marijuana    Comment: marijauana  . Sexual activity: None  Other Topics Concern  . None  Social History Narrative  . None   Past Surgical History:  Procedure Laterality Date  . TONSILLECTOMY    . WISDOM TOOTH EXTRACTION     Past Medical History:  Diagnosis Date  . Anxiety   . Arthritis   . Chronic constipation   . Colitis   . Colon polyp   . Depression   . GERD (gastroesophageal reflux disease)   . Headaches due to old head injury   . Hearing loss   . Hyperlipidemia   . Hypertension   . Insomnia, unspecified   . Loss of appetite   . Stroke ([place])   . Trouble swallowing    BP (!) 156/81 (BP Location: Left Arm, Patient Position: Sitting, Cuff Size: Normal)   Pulse 61   Resp 14   SpO2 98%   Opioid Risk Score:   Fall Risk Score:  `1  Depression screen PHQ 2/9  Depression screen [facility] 2/9 08/12/2016 02/26/2015 09/21/2013  Decreased Interest 2 2 3   Down, Depressed, Hopeless 2 2 2   PHQ - 2 Score 4 4 5   Altered sleeping - 1 3  Tired, decreased energy - 2 3  Change in appetite - 0 0  Feeling bad or failure about yourself  - 2 2  Trouble concentrating - 1 1  Moving slowly or fidgety/restless - 1 1  Suicidal thoughts - 0 0  PHQ-9 Score - 11 15    Review of Systems  HENT: Negative.   Eyes: Negative.   Respiratory: Negative.   Cardiovascular: Negative.   Endocrine: Negative.   Genitourinary: Negative.   Musculoskeletal: Positive for arthralgias, back pain and gait problem.       Spasms  Skin: Negative.   Allergic/Immunologic: Negative.   Neurological: Positive for dizziness, tremors, weakness, numbness and headaches.  Hematological: Negative.   Psychiatric/Behavioral: Positive for confusion.  All other systems reviewed and are negative.      Objective:   Physical Exam  Constitutional: He is oriented to person, place, and time. He  appears well-developed and well-nourished.  HENT:  Head: Normocephalic and atraumatic.  Eyes: Conjunctivae and EOM are normal. Pupils are equal, round, and reactive to light.  Neurological: He is alert and oriented to person, place, and time.  Skin: Skin is warm and dry.  Psychiatric: He has a normal mood and affect.  Nursing note and vitals reviewed.  Motor strength is 4+ in the right deltoid bicep tricep grip hip flexor knee extensor ankle dorsiflexor Patient has some pain with right shoulder range of motion Motor strength on the left side is 5/5 in the deltoid bicep tricep grip hip flexor knee extensor ankle dorsiflexor Gait shows cocontraction of quadricep and hamstring during ambulation.        Assessment & Plan:  #1.  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  He does get good relief with tramadol 50 mg 3 times daily. Reviewed PMP aware website no evidence of multiple prescribers  Physical medicine rehabilitation follow-up in 6 months

## 2017-10-06 ENCOUNTER — Other Ambulatory Visit: Payer: Self-pay | Admitting: Physical Medicine & Rehabilitation

## 2017-10-07 ENCOUNTER — Telehealth: Payer: Self-pay

## 2017-10-07 NOTE — Telephone Encounter (Signed)
Prior auth returned today approved 10-07-17

## 2017-10-07 NOTE — Telephone Encounter (Signed)
Recieved faxed notification of needed prior auth for this patient for diclofenac sodium gel, prior auth initiated on 10-06-17

## 2017-11-05 ENCOUNTER — Other Ambulatory Visit: Payer: Self-pay | Admitting: Physical Medicine & Rehabilitation

## 2018-02-05 ENCOUNTER — Encounter: Payer: Self-pay | Admitting: Physical Medicine & Rehabilitation

## 2018-02-05 ENCOUNTER — Ambulatory Visit: Payer: Medicare Other | Admitting: Physical Medicine & Rehabilitation

## 2018-02-05 ENCOUNTER — Encounter: Payer: Medicare Other | Attending: Physical Medicine & Rehabilitation

## 2018-02-05 VITALS — BP 126/79 | HR 67 | Ht 71.0 in | Wt 152.0 lb

## 2018-02-05 DIAGNOSIS — G8110 Spastic hemiplegia affecting unspecified side: Secondary | ICD-10-CM | POA: Diagnosis not present

## 2018-02-05 DIAGNOSIS — Z8249 Family history of ischemic heart disease and other diseases of the circulatory system: Secondary | ICD-10-CM | POA: Diagnosis not present

## 2018-02-05 DIAGNOSIS — K219 Gastro-esophageal reflux disease without esophagitis: Secondary | ICD-10-CM | POA: Diagnosis not present

## 2018-02-05 DIAGNOSIS — M25511 Pain in right shoulder: Secondary | ICD-10-CM | POA: Insufficient documentation

## 2018-02-05 DIAGNOSIS — E785 Hyperlipidemia, unspecified: Secondary | ICD-10-CM | POA: Insufficient documentation

## 2018-02-05 DIAGNOSIS — Z5181 Encounter for therapeutic drug level monitoring: Secondary | ICD-10-CM | POA: Diagnosis not present

## 2018-02-05 DIAGNOSIS — I10 Essential (primary) hypertension: Secondary | ICD-10-CM | POA: Insufficient documentation

## 2018-02-05 MED ORDER — TRAMADOL HCL 50 MG PO TABS: 50.0000 mg | ORAL_TABLET | Freq: Three times a day (TID) | ORAL | 5 refills | Status: DC | PRN

## 2018-02-05 NOTE — Progress Notes (Signed)
Subjective:    Patient ID: Carlos Grimes, male    DOB: 09/19/1945, 72 y.o.   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 He has a residual Mild right hemiparesis. He is independent with all his self-care and mobility. He has occasional right shoulder pain  Patient has been on chronic tramadol which has been helpful for reducing his central poststroke pain syndrome.  He states that he does get fairly significant withdrawal reaction if he misses doses.  His current dose is 50 mg 3 times daily as needed  He is upset today that he has to complete so much paperwork just for being on tramadol Pain Inventory Average Pain 6 Pain Right Now 6 My pain is sharp, burning, dull, stabbing, tingling and aching  In the last 24 hours, has pain interfered with the following? General activity 7 Relation with others 7 Enjoyment of life 7 What TIME of day is your pain at its worst? . Sleep (in general) Fair  Pain is worse with: walking, standing and some activites Pain improves with: rest Relief from Meds: 1  Mobility walk without assistance use a cane ability to climb steps?  yes do you drive?  yes  Function retired I need assistance with the following:  meal prep, household duties and shopping  Neuro/Psych bladder control problems numbness tremor tingling trouble walking spasms dizziness confusion anxiety  Prior Studies Any changes since last visit?  no  Physicians involved in your care Any changes since last visit?  no   Family History  Problem Relation Age of Onset  . Cancer Father   . Heart failure Father   . Diabetes Maternal Grandmother    Social History   Socioeconomic History  . Marital status: Married    Spouse name: Not on file  . Number of children: 5  . Years of education: Not on file  . Highest education level: Not on file  Occupational History  . Occupation: retired  Scientific laboratory technician  .  Financial resource strain: Not on file  . Food insecurity:    Worry: Not on file    Inability: Not on file  . Transportation needs:    Medical: Not on file    Non-medical: Not on file  Tobacco Use  . Smoking status: Never Smoker  . Smokeless tobacco: Never Used  . Tobacco comment: Smokes marijuana  Substance and Sexual Activity  . Alcohol use: Yes    Alcohol/week: 1.2 oz    Types: 2 Cans of beer per week  . Drug use: Yes    Types: Marijuana    Comment: marijauana  . Sexual activity: Not on file  Lifestyle  . Physical activity:    Days per week: Not on file    Minutes per session: Not on file  . Stress: Not on file  Relationships  . Social connections:    Talks on phone: Not on file    Gets together: Not on file    Attends religious service: Not on file    Active member of club or organization: Not on file    Attends meetings of clubs or organizations: Not on file    Relationship status: Not on file  Other Topics Concern  . Not on file  Social History Narrative  . Not on file   Past Surgical History:  Procedure Laterality Date  . TONSILLECTOMY    . WISDOM TOOTH EXTRACTION     Past  Medical History:  Diagnosis Date  . Anxiety   . Arthritis   . Chronic constipation   . Colitis   . Colon polyp   . Depression   . GERD (gastroesophageal reflux disease)   . Headaches due to old head injury   . Hearing loss   . Hyperlipidemia   . Hypertension   . Insomnia, unspecified   . Loss of appetite   . Stroke ([place])   . Trouble swallowing    Pulse 67   Ht 5\' 11"  (1.803 m)   Wt 152 lb (68.9 kg)   SpO2 96%   BMI 21.20 kg/m   Opioid Risk Score:   Fall Risk Score:  `1  Depression screen PHQ 2/9  Depression screen [facility] 2/9 08/12/2016 02/26/2015 09/21/2013  Decreased Interest 2 2 3   Down, Depressed, Hopeless 2 2 2   PHQ - 2 Score 4 4 5   Altered sleeping - 1 3  Tired, decreased energy - 2 3  Change in appetite - 0 0  Feeling bad or failure about yourself  - 2 2    Trouble concentrating - 1 1  Moving slowly or fidgety/restless - 1 1  Suicidal thoughts - 0 0  PHQ-9 Score - 11 15     Review of Systems  Constitutional: Negative.   HENT: Negative.   Eyes: Negative.   Respiratory: Negative.   Cardiovascular: Negative.   Gastrointestinal: Negative.   Endocrine: Negative.   Genitourinary: Positive for difficulty urinating.  Musculoskeletal: Positive for arthralgias, back pain, gait problem and myalgias.  Skin: Negative.   Allergic/Immunologic: Negative.   Neurological: Positive for dizziness, tremors and numbness.  Hematological: Negative.   Psychiatric/Behavioral: Positive for confusion. The patient is nervous/anxious.   All other systems reviewed and are negative.      Objective:   Physical Exam  Constitutional: He is oriented to person, place, and time. He appears well-developed and well-nourished.  HENT:  Head: Normocephalic and atraumatic.  Neurological: He is alert and oriented to person, place, and time.  Skin: Skin is warm and dry.  Psychiatric: He has a normal mood and affect.  Nursing note and vitals reviewed.  Motor strength is 4/5 in the right deltoid bicep tricep grip hip flexor knee extensor ankle dorsiflexor 5/5 in left deltoid bicep tricep grip hip flexor knee extensor ankle dorsiflexor He has a stiff leg gait he has some mild hip hiking on the right side.  He does not use an assistive device.       Assessment & Plan:  1.  Central posterior pain syndrome he has mild residual right hemiparesis and mild spasticity which is well controlled with baclofen 10 mg 4 times daily.  Indication for chronic opioid: Central post stroke pain syndrome Medication and dose: Tramadol 50 mill grams 3 times daily # pills per month: 90 Last UDS date: Ordered on 02/05/2018 Opioid Treatment Agreement signed (Y/N): Yes 5/24 2019 Opioid Treatment Agreement last reviewed with patient:   [facility] reviewed this encounter (include red flags):  Yes  no red flags return to clinic in 6 months

## 2018-02-05 NOTE — Patient Instructions (Signed)
PLEASE TRY THERABAND ROWING EXERCISE TO HELP WITH SHOULDER PAIN

## 2018-03-08 ENCOUNTER — Other Ambulatory Visit: Payer: Self-pay | Admitting: Physical Medicine & Rehabilitation

## 2018-04-07 ENCOUNTER — Other Ambulatory Visit: Payer: Self-pay | Admitting: Physical Medicine & Rehabilitation

## 2018-04-07 DIAGNOSIS — R251 Tremor, unspecified: Secondary | ICD-10-CM

## 2018-04-08 NOTE — Telephone Encounter (Signed)
rec'd electronic refill request

## 2018-07-25 IMAGING — DX DG HIP (WITH OR WITHOUT PELVIS) 5+V BILAT
5 series · 5 of 5 positions shown · non-contrast
Comparison: None.

CLINICAL DATA: Right hip pain and chronic bilateral hip pain

EXAM:
DG HIP (WITH OR WITHOUT PELVIS) 5+V BILAT

[pelvis ap]
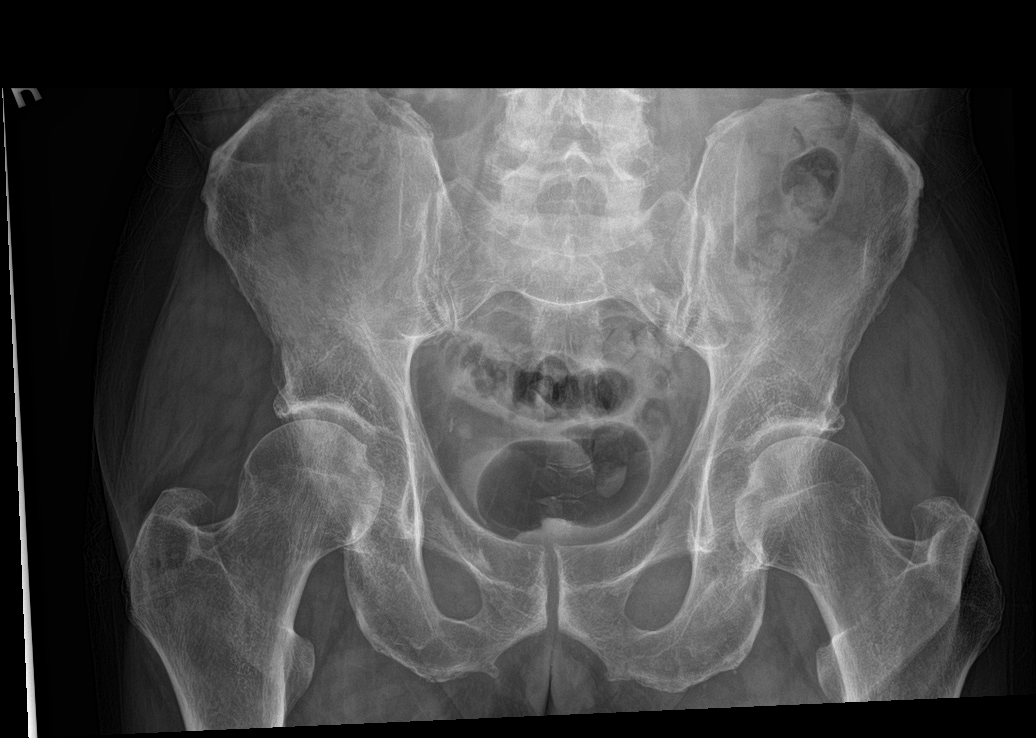

[hip ap (1 of 2)]
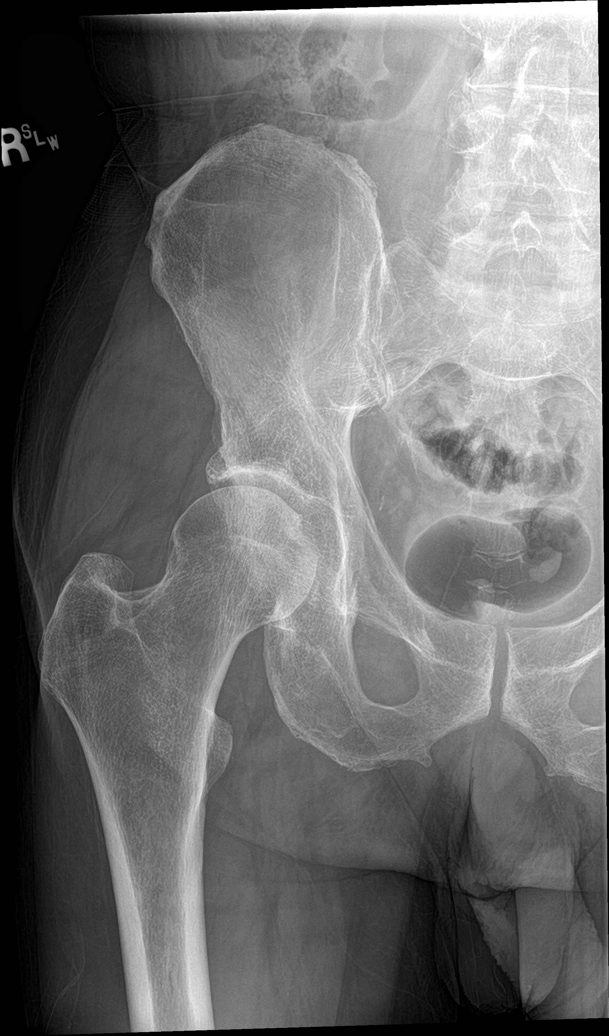

[hip lat (1 of 2)]
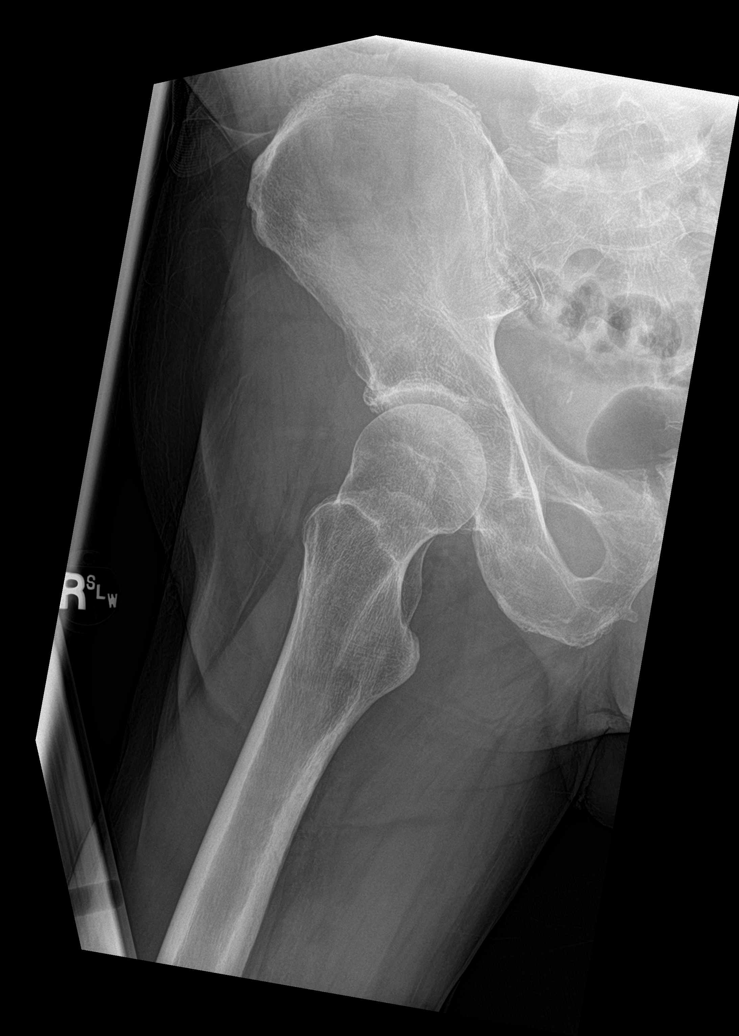

[hip ap (2 of 2)]
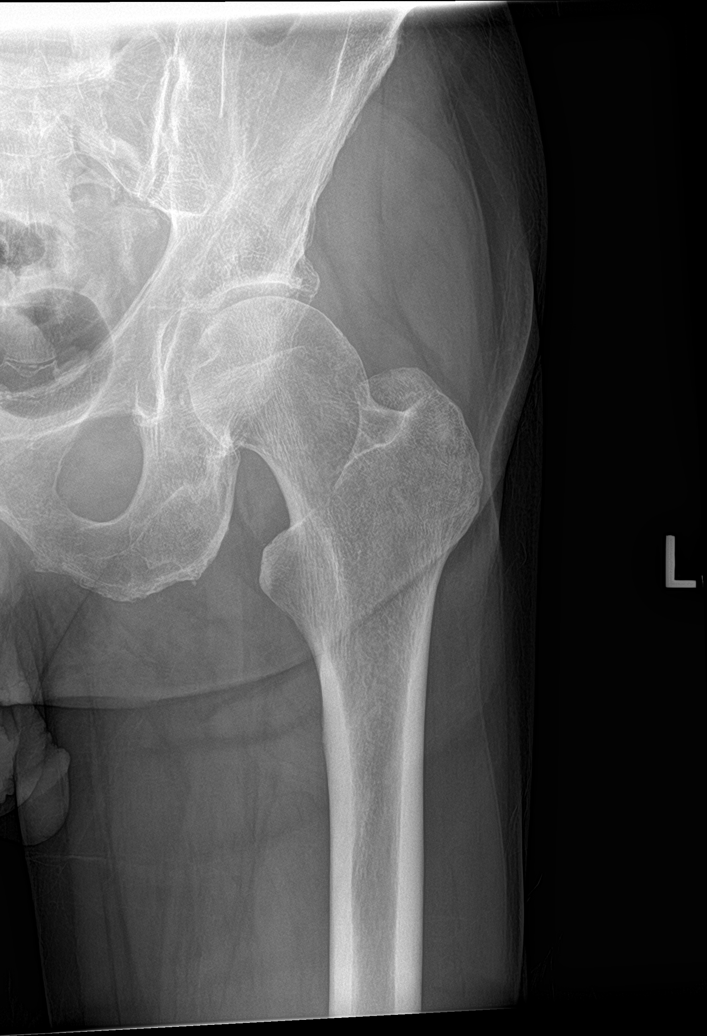

[hip lat (2 of 2)]
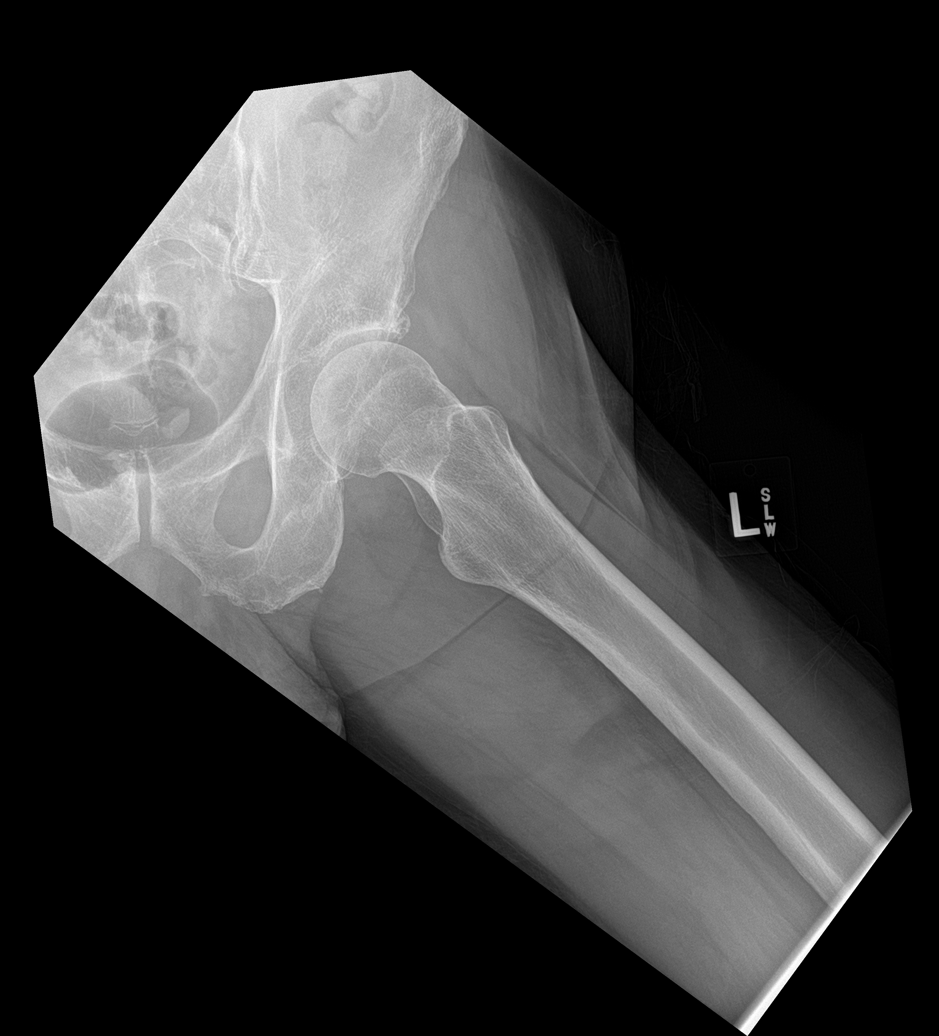

[5 of 5 positions shown; findings below may reference images not displayed]

FINDINGS: There is no evidence for fracture or dislocation. There are mild
degenerative changes of the bilateral hips with slight superior
joint space narrowing. Pubic symphysis is intact. Mild aspherical
appearance of left femoral head neck junction which can be seen with
impingement syndrome. There are vascular calcifications
IMPRESSION: 1. No acute osseous abnormality.
2. Mild degenerative changes.

## 2018-08-09 ENCOUNTER — Ambulatory Visit (HOSPITAL_BASED_OUTPATIENT_CLINIC_OR_DEPARTMENT_OTHER): Payer: Medicare Other | Admitting: Physical Medicine & Rehabilitation

## 2018-08-09 ENCOUNTER — Telehealth: Payer: Self-pay | Admitting: *Deleted

## 2018-08-09 DIAGNOSIS — Z79899 Other long term (current) drug therapy: Secondary | ICD-10-CM

## 2018-08-09 DIAGNOSIS — Z5181 Encounter for therapeutic drug level monitoring: Secondary | ICD-10-CM

## 2018-08-09 MED ORDER — TRAMADOL HCL 50 MG PO TABS: ORAL_TABLET | ORAL | 0 refills | Status: AC

## 2018-08-09 NOTE — Progress Notes (Signed)
Mr Spadaccini left because he did not want to do a UDS and admits to regular marijuana use. NO visit today. Dr Letta Pate ordered a taper of tramadol to be called to pharmacy. "Tramadol 25 mg qid x 7 d, 25 mg tid x 7 day, 25 mg bid x 7 day, 25 mg daily x7 day then stop.  Called to pharmacy and attempted to notify Mr Knerr, but his voicemail has not been set up and he did not answer. Marland Kitchen

## 2018-08-09 NOTE — Telephone Encounter (Signed)
Carlos Grimes returned our call.  I explained to him I was trying to tell him about the wean down dose of tramadol that we sent to his pharmacy.  He is very Patent attorney.

## 2022-01-16 DIAGNOSIS — J302 Other seasonal allergic rhinitis: Secondary | ICD-10-CM | POA: Diagnosis not present

## 2022-01-16 DIAGNOSIS — I10 Essential (primary) hypertension: Secondary | ICD-10-CM | POA: Diagnosis not present

## 2022-01-16 DIAGNOSIS — G890 Central pain syndrome: Secondary | ICD-10-CM | POA: Diagnosis not present

## 2022-01-24 DIAGNOSIS — H905 Unspecified sensorineural hearing loss: Secondary | ICD-10-CM | POA: Diagnosis not present

## 2022-03-24 DIAGNOSIS — H905 Unspecified sensorineural hearing loss: Secondary | ICD-10-CM | POA: Diagnosis not present

## 2022-03-25 DIAGNOSIS — J3089 Other allergic rhinitis: Secondary | ICD-10-CM | POA: Diagnosis not present

## 2022-03-25 DIAGNOSIS — Z0001 Encounter for general adult medical examination with abnormal findings: Secondary | ICD-10-CM | POA: Diagnosis not present

## 2022-03-25 DIAGNOSIS — R52 Pain, unspecified: Secondary | ICD-10-CM | POA: Diagnosis not present

## 2022-03-25 DIAGNOSIS — I10 Essential (primary) hypertension: Secondary | ICD-10-CM | POA: Diagnosis not present

## 2022-04-30 ENCOUNTER — Encounter (HOSPITAL_COMMUNITY): Payer: Self-pay

## 2022-04-30 ENCOUNTER — Emergency Department (HOSPITAL_COMMUNITY): Payer: Medicare Other

## 2022-04-30 ENCOUNTER — Other Ambulatory Visit: Payer: Self-pay

## 2022-04-30 ENCOUNTER — Observation Stay (HOSPITAL_COMMUNITY)
Admission: EM | Admit: 2022-04-30 | Discharge: 2022-05-01 | Disposition: A | Discharge status: Home / Self Care | Payer: Medicare Other | Attending: Internal Medicine | Admitting: Internal Medicine

## 2022-04-30 DIAGNOSIS — E876 Hypokalemia: Secondary | ICD-10-CM | POA: Diagnosis not present

## 2022-04-30 DIAGNOSIS — Z8673 Personal history of transient ischemic attack (TIA), and cerebral infarction without residual deficits: Secondary | ICD-10-CM | POA: Insufficient documentation

## 2022-04-30 DIAGNOSIS — E871 Hypo-osmolality and hyponatremia: Secondary | ICD-10-CM | POA: Diagnosis not present

## 2022-04-30 DIAGNOSIS — R1084 Generalized abdominal pain: Secondary | ICD-10-CM | POA: Diagnosis not present

## 2022-04-30 DIAGNOSIS — K5732 Diverticulitis of large intestine without perforation or abscess without bleeding: Principal | ICD-10-CM | POA: Insufficient documentation

## 2022-04-30 DIAGNOSIS — Z79899 Other long term (current) drug therapy: Secondary | ICD-10-CM | POA: Diagnosis not present

## 2022-04-30 DIAGNOSIS — R109 Unspecified abdominal pain: Secondary | ICD-10-CM | POA: Diagnosis not present

## 2022-04-30 DIAGNOSIS — K5792 Diverticulitis of intestine, part unspecified, without perforation or abscess without bleeding: Secondary | ICD-10-CM | POA: Diagnosis not present

## 2022-04-30 DIAGNOSIS — I10 Essential (primary) hypertension: Secondary | ICD-10-CM

## 2022-04-30 LAB — CREATININE, SERUM
Creatinine, Ser: 0.92 mg/dL (ref 0.61–1.24)
GFR, Estimated: >60 mL/min (ref >60)

## 2022-04-30 LAB — CBC WITH DIFFERENTIAL/PLATELET
Abs Immature Granulocytes: 0.1 10*3/uL — ABNORMAL HIGH (ref 0.00–0.07)
Basophils Absolute: 0 10*3/uL (ref 0.0–0.1)
Basophils Relative: 0 %
Eosinophils Absolute: 0 10*3/uL (ref 0.0–0.5)
Eosinophils Relative: 0 %
HCT: 41.2 % (ref 39.0–52.0)
Hemoglobin: 14.5 g/dL (ref 13.0–17.0)
Immature Granulocytes: 1 %
Lymphocytes Relative: 10 %
Lymphs Abs: 1.9 10*3/uL (ref 0.7–4.0)
MCH: 31.8 pg (ref 26.0–34.0)
MCHC: 35.2 g/dL (ref 30.0–36.0)
MCV: 90.4 fL (ref 80.0–100.0)
Monocytes Absolute: 0.9 10*3/uL (ref 0.1–1.0)
Monocytes Relative: 5 %
Neutro Abs: 16.8 10*3/uL — ABNORMAL HIGH (ref 1.7–7.7)
Neutrophils Relative %: 84 %
Platelets: 273 10*3/uL (ref 150–400)
RBC: 4.56 MIL/uL (ref 4.22–5.81)
RDW: 13.1 % (ref 11.5–15.5)
WBC: 19.7 10*3/uL — ABNORMAL HIGH (ref 4.0–10.5)
nRBC: 0 % (ref 0.0–0.2)

## 2022-04-30 LAB — URINALYSIS, ROUTINE W REFLEX MICROSCOPIC
Bilirubin Urine: NEGATIVE
Glucose, UA: NEGATIVE mg/dL
Hgb urine dipstick: NEGATIVE
Ketones, ur: 20 mg/dL — AB
Leukocytes,Ua: NEGATIVE
Nitrite: NEGATIVE
Protein, ur: 30 mg/dL — AB
Specific Gravity, Urine: 1.014 (ref 1.005–1.030)
pH: 7 (ref 5.0–8.0)

## 2022-04-30 LAB — CBC
HCT: 42.8 % (ref 39.0–52.0)
Hemoglobin: 14.5 g/dL (ref 13.0–17.0)
MCH: 31.4 pg (ref 26.0–34.0)
MCHC: 33.9 g/dL (ref 30.0–36.0)
MCV: 92.6 fL (ref 80.0–100.0)
Platelets: 257 10*3/uL (ref 150–400)
RBC: 4.62 MIL/uL (ref 4.22–5.81)
RDW: 13.3 % (ref 11.5–15.5)
WBC: 18.9 10*3/uL — ABNORMAL HIGH (ref 4.0–10.5)
nRBC: 0 % (ref 0.0–0.2)

## 2022-04-30 LAB — COMPREHENSIVE METABOLIC PANEL
ALT: 25 U/L (ref 0–44)
AST: 27 U/L (ref 15–41)
Albumin: 4.4 g/dL (ref 3.5–5.0)
Alkaline Phosphatase: 65 U/L (ref 38–126)
Anion gap: 11 (ref 5–15)
BUN: 14 mg/dL (ref 8–23)
CO2: 26 mmol/L (ref 22–32)
Calcium: 9.1 mg/dL (ref 8.9–10.3)
Chloride: 93 mmol/L — ABNORMAL LOW (ref 98–111)
Creatinine, Ser: 0.84 mg/dL (ref 0.61–1.24)
GFR, Estimated: >60 mL/min (ref >60)
Glucose, Bld: 140 mg/dL — ABNORMAL HIGH (ref 70–99)
Potassium: 2.5 mmol/L — CL (ref 3.5–5.1)
Sodium: 130 mmol/L — ABNORMAL LOW (ref 135–145)
Total Bilirubin: 2.8 mg/dL — ABNORMAL HIGH (ref 0.3–1.2)
Total Protein: 8.4 g/dL — ABNORMAL HIGH (ref 6.5–8.1)

## 2022-04-30 LAB — LIPASE, BLOOD: Lipase: 25 U/L (ref 11–51)

## 2022-04-30 MED ORDER — SODIUM CHLORIDE 0.9 % IV SOLN
3.0000 g | Freq: Once | INTRAVENOUS | Status: AC
  Administered 2022-04-30: 3 g via INTRAVENOUS
  Filled 2022-04-30: qty 8

## 2022-04-30 MED ORDER — CLONIDINE HCL 0.1 MG PO TABS
0.1000 mg | ORAL_TABLET | Freq: Two times a day (BID) | ORAL | Status: DC
  Administered 2022-04-30 – 2022-05-01 (×2): 0.1 mg via ORAL
  Filled 2022-04-30 (×2): qty 1

## 2022-04-30 MED ORDER — MORPHINE SULFATE (PF) 2 MG/ML IV SOLN
2.0000 mg | INTRAVENOUS | Status: DC | PRN
  Administered 2022-04-30: 2 mg via INTRAVENOUS
  Filled 2022-04-30: qty 1

## 2022-04-30 MED ORDER — BACLOFEN 10 MG PO TABS
10.0000 mg | ORAL_TABLET | Freq: Four times a day (QID) | ORAL | Status: DC
  Administered 2022-04-30 – 2022-05-01 (×5): 10 mg via ORAL
  Filled 2022-04-30 (×5): qty 1

## 2022-04-30 MED ORDER — ONDANSETRON HCL 4 MG/2ML IJ SOLN
4.0000 mg | Freq: Once | INTRAMUSCULAR | Status: AC
  Administered 2022-04-30: 4 mg via INTRAVENOUS
  Filled 2022-04-30: qty 2

## 2022-04-30 MED ORDER — ACETAMINOPHEN 325 MG PO TABS
650.0000 mg | ORAL_TABLET | Freq: Four times a day (QID) | ORAL | Status: DC | PRN
  Administered 2022-04-30: 650 mg via ORAL
  Filled 2022-04-30: qty 2

## 2022-04-30 MED ORDER — SODIUM CHLORIDE 0.9 % IV BOLUS
500.0000 mL | Freq: Once | INTRAVENOUS | Status: AC
  Administered 2022-04-30: 500 mL via INTRAVENOUS

## 2022-04-30 MED ORDER — DICLOFENAC SODIUM 1 % EX GEL
4.0000 g | Freq: Four times a day (QID) | CUTANEOUS | Status: DC
Start: 2022-04-30 — End: 2022-05-01
  Administered 2022-04-30 – 2022-05-01 (×5): 4 g via TOPICAL
  Filled 2022-04-30 (×2): qty 100

## 2022-04-30 MED ORDER — ONDANSETRON HCL 4 MG PO TABS: 4.0000 mg | ORAL_TABLET | Freq: Four times a day (QID) | ORAL | Status: DC | PRN

## 2022-04-30 MED ORDER — ACETAMINOPHEN 650 MG RE SUPP: 650.0000 mg | Freq: Four times a day (QID) | RECTAL | Status: DC | PRN

## 2022-04-30 MED ORDER — LORATADINE 10 MG PO TABS
5.0000 mg | ORAL_TABLET | Freq: Every evening | ORAL | Status: DC
  Filled 2022-04-30: qty 1

## 2022-04-30 MED ORDER — FERROUS SULFATE 325 (65 FE) MG PO TABS
325.0000 mg | ORAL_TABLET | Freq: Every day | ORAL | Status: DC
  Administered 2022-04-30 – 2022-05-01 (×2): 325 mg via ORAL
  Filled 2022-04-30 (×2): qty 1

## 2022-04-30 MED ORDER — POTASSIUM CHLORIDE 10 MEQ/100ML IV SOLN
10.0000 meq | INTRAVENOUS | Status: AC
  Administered 2022-04-30 (×4): 10 meq via INTRAVENOUS
  Filled 2022-04-30 (×4): qty 100

## 2022-04-30 MED ORDER — IOHEXOL 300 MG/ML  SOLN
100.0000 mL | Freq: Once | INTRAMUSCULAR | Status: AC | PRN
  Administered 2022-04-30: 100 mL via INTRAVENOUS

## 2022-04-30 MED ORDER — LOSARTAN POTASSIUM 50 MG PO TABS
50.0000 mg | ORAL_TABLET | Freq: Every day | ORAL | Status: DC
Start: 2022-04-30 — End: 2022-05-01
  Administered 2022-04-30 – 2022-05-01 (×2): 50 mg via ORAL
  Filled 2022-04-30 (×2): qty 1

## 2022-04-30 MED ORDER — POTASSIUM CHLORIDE CRYS ER 20 MEQ PO TBCR
40.0000 meq | EXTENDED_RELEASE_TABLET | Freq: Two times a day (BID) | ORAL | Status: DC
Start: 2022-04-30 — End: 2022-05-01
  Administered 2022-04-30 – 2022-05-01 (×3): 40 meq via ORAL
  Filled 2022-04-30 (×3): qty 2

## 2022-04-30 MED ORDER — MORPHINE SULFATE (PF) 4 MG/ML IV SOLN
4.0000 mg | Freq: Once | INTRAVENOUS | Status: AC
  Administered 2022-04-30: 4 mg via INTRAVENOUS
  Filled 2022-04-30: qty 1

## 2022-04-30 MED ORDER — RED YEAST RICE 600 MG PO CAPS
1.0000 | ORAL_CAPSULE | Freq: Every day | ORAL | Status: DC
Start: 2022-04-30 — End: 2022-04-30

## 2022-04-30 MED ORDER — LACTATED RINGERS IV SOLN: INTRAVENOUS | Status: AC

## 2022-04-30 MED ORDER — CLONAZEPAM 0.5 MG PO TABS: 0.5000 mg | ORAL_TABLET | Freq: Two times a day (BID) | ORAL | Status: DC | PRN

## 2022-04-30 MED ORDER — ENOXAPARIN SODIUM 40 MG/0.4ML IJ SOSY
40.0000 mg | PREFILLED_SYRINGE | INTRAMUSCULAR | Status: DC
  Administered 2022-04-30: 40 mg via SUBCUTANEOUS
  Filled 2022-04-30: qty 0.4

## 2022-04-30 MED ORDER — SODIUM CHLORIDE 0.9 % IV SOLN
3.0000 g | Freq: Four times a day (QID) | INTRAVENOUS | Status: DC
  Administered 2022-04-30 – 2022-05-01 (×3): 3 g via INTRAVENOUS
  Filled 2022-04-30 (×3): qty 8

## 2022-04-30 MED ORDER — ONDANSETRON HCL 4 MG/2ML IJ SOLN: 4.0000 mg | Freq: Four times a day (QID) | INTRAMUSCULAR | Status: DC | PRN

## 2022-04-30 NOTE — Progress Notes (Signed)
Pharmacy Antibiotic Note  Carlos Grimes is a 76 y.o. male admitted on 04/30/2022 with  intra-abdominal infection .  Pharmacy has been consulted for Unasyn dosing.  Plan: Unasyn 3000 mg IV every 6 hours. Monitor labs, c/s, and patient improvement.   Height: '5\' 11"'$  (180.3 cm) Weight: 70.3 kg (155 lb) IBW/kg (Calculated) : 75.3  Temp (24hrs), Avg:98.4 F (36.9 C), Min:98 F (36.7 C), Max:98.7 F (37.1 C)  Recent Labs  Lab 04/30/22 0926  WBC 19.7*  CREATININE 0.84    Estimated Creatinine Clearance: 75.6 mL/min (by C-G formula based on SCr of 0.84 mg/dL).    Allergies  Allergen Reactions   Prednisone     Other reaction(s): Chest Pain, Confusion, Dizziness, Hallucinations, Hypertension, Nervous   Zoloft [Sertraline Hcl] Shortness Of Breath, Rash and Other (See Comments)    Tightness in chest, chest pain   Pregabalin Swelling   Amitriptyline Other (See Comments)    Caused poisioning in system   Cymbalta [Duloxetine Hcl] Other (See Comments)    Unable to urinate   Gabapentin     Dream   Hydromorphone Hcl Other (See Comments)    halucinations   Statins     Joint, pain    Antimicrobials this admission: Unasyn 8/16 >>  Microbiology results: None pending  Thank you for allowing pharmacy to be a part of this patient's care.  Ramond Craver 04/30/2022 12:57 PM

## 2022-04-30 NOTE — ED Notes (Signed)
Critical potassium level- 2.5, reported to L. Threasa Alpha, PA-C

## 2022-04-30 NOTE — ED Provider Notes (Signed)
[facility] EMERGENCY DEPARTMENT Provider Note   CSN: [id number] Arrival date & time: 04/30/22  6712     History  Chief Complaint  Patient presents with   Abdominal Pain    Carlos Grimes is a 76 y.o. male.  The history is provided by the patient. No language interpreter was used.  Abdominal Pain Pain location:  Generalized Pain quality: aching   Pain radiates to:  Does not radiate Pain severity:  Severe Onset quality:  Gradual Duration:  3 days Timing:  Constant Progression:  Worsening Chronicity:  New Context: not alcohol use and not sick contacts   Relieved by:  Nothing Worsened by:  Nothing Ineffective treatments:  None tried Associated symptoms: nausea   Associated symptoms: no vomiting   Risk factors: no alcohol abuse        Home Medications Prior to Admission medications   Medication Sig Start Date End Date Taking? Authorizing Provider  amLODipine (NORVASC) 10 MG tablet TAKE ONE TABLET BY MOUTH EVERY DAY Patient not taking: Reported on 04/30/2022 06/26/14   Lysbeth Penner, FNP  ASHWAGANDHA PO Take by mouth.    [provider]  b complex vitamins capsule Take 1 capsule by mouth daily.    [provider]  baclofen (LIORESAL) 10 MG tablet TAKE ONE TABLET BY MOUTH FOUR (4) TIMES DAILY 11/05/17   Kirsteins, Luanna Salk, MD  clonazePAM (KLONOPIN) 0.5 MG tablet TAKE ONE TABLET BY MOUTH TWICE DAILY AS NEEDED FOR ANXIETY 04/08/18   Kirsteins, Luanna Salk, MD  cloNIDine (CATAPRES) 0.1 MG tablet Take 0.1 mg by mouth 2 (two) times daily. 03/01/22   [provider]  Coenzyme Q10 (CO Q 10) 100 MG CAPS Take 1 capsule by mouth 2 (two) times daily.    [provider]  diclofenac sodium (VOLTAREN) 1 % GEL APPLY 2 GRAMS TOPICALLY FOUR (4) TIMES DAILY 03/09/18   Kirsteins, Luanna Salk, MD  Ferrous Fumarate (IRON) 18 MG TBCR Take 1 tablet by mouth daily.    [provider]  Garlic Oil 2 MG CAPS Take 1 tablet by mouth.    [provider]  levocetirizine (XYZAL) 5 MG tablet Take 1 tablet by mouth daily. In the pm 06/02/18   [provider]  losartan-hydrochlorothiazide (HYZAAR) 50-12.5 MG tablet Take 1 tablet by mouth 2 (two) times daily. 02/16/22   [provider]  Multiple Vitamin (MULTIVITAMIN WITH MINERALS) TABS Take 1 tablet by mouth daily.    [provider]  nystatin cream (MYCOSTATIN) Apply 1 application topically 2 (two) times daily. Apply to sores on feet    [provider]  Omega-3 Fatty Acids (SALMON OIL-1000 PO) Take 1,000 mg by mouth daily.    [provider]  Red Yeast Rice 600 MG CAPS Take 1 capsule by mouth daily.    [provider]  Turmeric 450 MG CAPS Take 1 tablet by mouth.    [provider]  VALERIAN ROOT PO Take by mouth.    [provider]      Allergies    Prednisone, Zoloft [sertraline hcl], Pregabalin, Amitriptyline, Cymbalta [duloxetine hcl], Gabapentin, Hydromorphone hcl, and Statins    Review of Systems   Review of Systems  Gastrointestinal:  Positive for abdominal pain and nausea. Negative for vomiting.  All other systems reviewed and are negative.   Physical Exam Updated Vital Signs BP (!) 143/86   Pulse 82   Temp 98 F (36.7 C) (Oral)   Resp 16   Ht '5\' 11"'$  (  1.803 m)   Wt 70.3 kg   SpO2 94%   BMI 21.62 kg/m  Physical Exam Vitals and nursing note reviewed.  Constitutional:      Appearance: He is well-developed.  HENT:     Head: Normocephalic.  Eyes:     Extraocular Movements: Extraocular movements intact.     Pupils: Pupils are equal, round, and reactive to light.  Cardiovascular:     Rate and Rhythm: Normal rate and regular rhythm.  Pulmonary:     Effort: Pulmonary effort is normal.  Abdominal:     General: Abdomen is flat. Bowel sounds are normal. There is no distension.     Palpations: Abdomen is soft.     Tenderness: There is abdominal tenderness in the right lower quadrant and left lower  quadrant.  Musculoskeletal:        General: Normal range of motion.     Cervical back: Normal range of motion.  Skin:    General: Skin is warm.  Neurological:     General: No focal deficit present.     Mental Status: He is alert and oriented to person, place, and time.  Psychiatric:        Mood and Affect: Mood normal.     ED Results / Procedures / Treatments   Labs (all labs ordered are listed, but only abnormal results are displayed) Labs Reviewed  CBC WITH DIFFERENTIAL/PLATELET - Abnormal; Notable for the following components:      Result Value   WBC 19.7 (*)    Neutro Abs 16.8 (*)    Abs Immature Granulocytes 0.10 (*)    All other components within normal limits  COMPREHENSIVE METABOLIC PANEL - Abnormal; Notable for the following components:   Sodium 130 (*)    Potassium 2.5 (*)    Chloride 93 (*)    Glucose, Bld 140 (*)    Total Protein 8.4 (*)    Total Bilirubin 2.8 (*)    All other components within normal limits  URINALYSIS, ROUTINE W REFLEX MICROSCOPIC - Abnormal; Notable for the following components:   Ketones, ur 20 (*)    Protein, ur 30 (*)    Bacteria, UA RARE (*)    All other components within normal limits  LIPASE, BLOOD    EKG None  Radiology CT ABDOMEN PELVIS W CONTRAST  Result Date: 04/30/2022 CLINICAL DATA:  Right-sided abdominal pain and nausea EXAM: CT ABDOMEN AND PELVIS WITH CONTRAST TECHNIQUE: Multidetector CT imaging of the abdomen and pelvis was performed using the standard protocol following bolus administration of intravenous contrast. RADIATION DOSE REDUCTION: This exam was performed according to the departmental dose-optimization program which includes automated exposure control, adjustment of the mA and/or kV according to patient size and/or use of iterative reconstruction technique. CONTRAST:  153m OMNIPAQUE IOHEXOL 300 MG/ML  SOLN COMPARISON:  None Available. FINDINGS: Lower chest: Normal heart size no pericardial effusion. The visualized  bibasilar lungs are clear. Hepatobiliary: No focal liver abnormality is seen. Mild focal fat deposition near the falciform ligament. No gallstones, gallbladder wall thickening, or biliary dilatation. Pancreas: Mild focal pancreatic ductal dilation measuring up to 8 mm (series 2, image 35), without an obstructive lesion identified. No peripancreatic fat stranding. Spleen: Normal in size without focal abnormality. Adrenals/Urinary Tract: Incidental visualization of an 11 mm right adrenal nodule (series 2, image 30) and an 11 mm left adrenal nodule (series 2, image 29), both with Hounsfield units of about 76 in the postcontrast study. Symmetric enhancement of bilateral kidneys and no  hydronephrosis. The urinary bladder is normal in appearance. Stomach/Bowel: Extensive sigmoid colon diverticulosis. Focal bowel wall thickening of 2 adjacent loops of sigmoid colon in the right lower quadrant with moderate surrounding fat stranding (series 2, image 68; series 5, image 41). No definite free air or adjacent fluid collection. No bowel obstruction. Normal appearance of the appendix. Vascular/Lymphatic: Normal course and caliber of the abdominal aorta and its branches. Severe calcified atherosclerosis. Reproductive: Coarse calcifications within the prostate gland. Other: No abdominal wall hernia or abnormality. No abdominopelvic ascites. Musculoskeletal: No acute or significant osseous findings. IMPRESSION: 1. Acute uncomplicated sigmoid diverticulitis involving 2 adjacent loops of sigmoid colon in the right lower quadrant. 2. Mild focal pancreatic ductal dilation without an obstructing lesion identified. This is of uncertain clinical significance. Consider correlation with lipase/LFTs and further assessment with MRCP in the non-emergent setting. 3. Incidentally visualized bilateral adrenal nodules, likely benign adenomas. Recommend 1 year follow up adrenal washout CT. If stable for > 1 year, no further follow-up imaging. JACR  2017 Aug; 14(8):1038-44, JCAT 2016 Mar-Apr; 40(2):194-200, Urol J 2006 Spring; 3(2):71-4. Electronically Signed   By: Beryle Flock M.D.   On: 04/30/2022 11:03    Procedures Procedures    Medications Ordered in ED Medications  potassium chloride 10 mEq in 100 mL IVPB (10 mEq Intravenous New Bag/Given 04/30/22 1042)  sodium chloride 0.9 % bolus 500 mL (0 mLs Intravenous Stopped 04/30/22 1021)  morphine (PF) 4 MG/ML injection 4 mg (4 mg Intravenous Given 04/30/22 0949)  ondansetron (ZOFRAN) injection 4 mg (4 mg Intravenous Given 04/30/22 0947)  iohexol (OMNIPAQUE) 300 MG/ML solution 100 mL (100 mLs Intravenous Contrast Given 04/30/22 1021)    ED Course/ Medical Decision Making/ A&P                           Medical Decision Making Amount and/or Complexity of Data Reviewed External Data Reviewed: notes.    Details: Primary  care notes reviewed Labs: ordered. Decision-making details documented in ED Course.    Details: labs ordered reviewed and interpreted,  Pt has an elevated wbc count of 19.7  sodium is 130  K is 2.5. Radiology: ordered.    Details: Ct ordered reviewed and interpreted .  IMPRESSION: 1. Acute uncomplicated sigmoid diverticulitis involving 2 adjacent loops of sigmoid colon in the right lower quadrant. 2. Mild focal pancreatic ductal dilation without an obstructing lesion identified. Discussion of management or test interpretation with external provider(s): Hospitalist consulted  Risk Prescription drug management. Risk Details: Pt given Iv fluids,  Iv potassium and IV Unasyn .             Final Clinical Impression(s) / ED Diagnoses Final diagnoses:  Generalized abdominal pain  Diverticulitis  Hypokalemia  Hyponatremia    Rx / DC Orders ED Discharge Orders     None         Sidney Ace 04/30/22 [place], MD 04/30/22 2315

## 2022-04-30 NOTE — ED Notes (Signed)
Inside of pt's bag of medications and wallet, small black semi-automatic pistol was found. Pt stated "you caught me with my pistol" and stated he lived [place] neighborhood and always kept it with him. I asked pt why he answered no when asked if he had weapons on him upon arrival, and he stated he wasn't aware someone had placed it back into his bag.

## 2022-04-30 NOTE — ED Triage Notes (Signed)
Patient states right flank pain with radiation to genitals and leg per patient. Patient thinks he may have had kidney stones some years back. Patient states nausea and headache.

## 2022-04-30 NOTE — ED Notes (Signed)
Pt urinated 475 10 min later an additional 250 urine

## 2022-04-30 NOTE — H&P (Signed)
History and Physical    Carlos Grimes MVE:[id number] DOB: March 19, 1946 DOA: 04/30/2022  PCP: Doyle Askew, PA-C   Patient coming from: Home  Chief Complaint: Lower abdominal pain  HPI: Carlos Grimes is a 76 y.o. male with medical history significant for anxiety, dyslipidemia, and hypertension, who presented to the ED with complaints of lower abdominal pain with nausea no vomiting over the last 3 days.  He denies any symptoms of diarrhea, fevers, or chills.  He denies any blood in his stools.  He denies any sick contacts.  He states that he takes his home medications as usual.   ED Course: Vital signs stable and patient is afebrile.  CT demonstrating acute, uncomplicated sigmoid diverticulitis as well as some mild pancreatic dilation.  No significant elevation in LFT.  Leukocytosis of 19,200 noted and potassium 2.5 and sodium 130.  He has been started on Unasyn and given some IV fluid as well as potassium repletion.  Review of Systems: Reviewed as noted above, states that he urinates quite frequently.  Past Medical History:  Diagnosis Date   Anxiety    Arthritis    Chronic constipation    Colitis    Colon polyp    Depression    GERD (gastroesophageal reflux disease)    Headaches due to old head injury    Hearing loss    Hyperlipidemia    Hypertension    Insomnia, unspecified    Loss of appetite    Stroke [facility])    Trouble swallowing     Past Surgical History:  Procedure Laterality Date   TONSILLECTOMY     WISDOM TOOTH EXTRACTION       reports that he has never smoked. He has never used smokeless tobacco. He reports current alcohol use of about 2.0 standard drinks of alcohol per week. He reports current drug use. Drug: Marijuana.  Allergies  Allergen Reactions   Prednisone     Other reaction(s): Chest Pain, Confusion, Dizziness, Hallucinations, Hypertension, Nervous   Zoloft [Sertraline Hcl] Shortness Of Breath, Rash and Other (See Comments)    Tightness in  chest, chest pain   Pregabalin Swelling   Amitriptyline Other (See Comments)    Caused poisioning in system   Cymbalta [Duloxetine Hcl] Other (See Comments)    Unable to urinate   Gabapentin     Dream   Hydromorphone Hcl Other (See Comments)    halucinations   Statins     Joint, pain    Family History  Problem Relation Age of Onset   Cancer Father    Heart failure Father    Diabetes Maternal Grandmother     Prior to Admission medications   Medication Sig Start Date End Date Taking? Authorizing Provider  b complex vitamins capsule Take 1 capsule by mouth daily.   Yes [provider]  baclofen (LIORESAL) 10 MG tablet TAKE ONE TABLET BY MOUTH FOUR (4) TIMES DAILY 11/05/17  Yes Kirsteins, Luanna Salk, MD  clonazePAM (KLONOPIN) 0.5 MG tablet TAKE ONE TABLET BY MOUTH TWICE DAILY AS NEEDED FOR ANXIETY 04/08/18  Yes Kirsteins, Luanna Salk, MD  cloNIDine (CATAPRES) 0.1 MG tablet Take 0.1 mg by mouth 2 (two) times daily. 03/01/22  Yes [provider]  Coenzyme Q10 (CO Q 10) 100 MG CAPS Take 1 capsule by mouth 2 (two) times daily.   Yes [provider]  diclofenac sodium (VOLTAREN) 1 % GEL APPLY 2 GRAMS TOPICALLY FOUR (4) TIMES DAILY 03/09/18  Yes Kirsteins, Luanna Salk, MD  Ferrous Fumarate (  IRON) 18 MG TBCR Take 1 tablet by mouth daily.   Yes [provider]  Garlic Oil 2 MG CAPS Take 1 tablet by mouth.   Yes [provider]  levocetirizine (XYZAL) 5 MG tablet Take 1 tablet by mouth daily. In the pm 06/02/18  Yes [provider]  losartan-hydrochlorothiazide (HYZAAR) 50-12.5 MG tablet Take 1 tablet by mouth 2 (two) times daily. 02/16/22  Yes [provider]  Multiple Vitamin (MULTIVITAMIN WITH MINERALS) TABS Take 1 tablet by mouth daily.   Yes [provider]  nystatin cream (MYCOSTATIN) Apply 1 application topically 2 (two) times daily. Apply to sores on feet   Yes [provider]  Omega-3 Fatty Acids (SALMON OIL-1000 PO)  Take 1,000 mg by mouth daily.   Yes [provider]  Red Yeast Rice 600 MG CAPS Take 1 capsule by mouth daily.   Yes [provider]  Turmeric 450 MG CAPS Take 1 tablet by mouth.   Yes [provider]  amLODipine (NORVASC) 10 MG tablet TAKE ONE TABLET BY MOUTH EVERY DAY Patient not taking: Reported on 04/30/2022 06/26/14   Lysbeth Penner, FNP    Physical Exam: Vitals:   04/30/22 1130 04/30/22 1217 04/30/22 1218 04/30/22 1230  BP: (!) 192/80 (!) 155/79  (!) 166/77  Pulse: 79 99 80 86  Resp:   15 18  Temp:    98.7 F (37.1 C)  TempSrc:    Oral  SpO2: 100% (!) 87% 94% 98%  Weight:      Height:        Constitutional: NAD, calm, comfortable Vitals:   04/30/22 1130 04/30/22 1217 04/30/22 1218 04/30/22 1230  BP: (!) 192/80 (!) 155/79  (!) 166/77  Pulse: 79 99 80 86  Resp:   15 18  Temp:    98.7 F (37.1 C)  TempSrc:    Oral  SpO2: 100% (!) 87% 94% 98%  Weight:      Height:       Eyes: lids and conjunctivae normal Neck: normal, supple Respiratory: clear to auscultation bilaterally. Normal respiratory effort. No accessory muscle use.  Cardiovascular: Regular rate and rhythm, no murmurs. Abdomen: no tenderness, no distention. Bowel sounds positive.  Musculoskeletal:  No edema. Skin: no rashes, lesions, ulcers.  Psychiatric: Flat affect  Labs on Admission: I have personally reviewed following labs and imaging studies  CBC: Recent Labs  Lab 04/30/22 0926  WBC 19.7*  NEUTROABS 16.8*  HGB 14.5  HCT 41.2  MCV 90.4  PLT 628   Basic Metabolic Panel: Recent Labs  Lab 04/30/22 0926  NA 130*  K 2.5*  CL 93*  CO2 26  GLUCOSE 140*  BUN 14  CREATININE 0.84  CALCIUM 9.1   GFR: Estimated Creatinine Clearance: 75.6 mL/min (by C-G formula based on SCr of 0.84 mg/dL). Liver Function Tests: Recent Labs  Lab 04/30/22 0926  AST 27  ALT 25  ALKPHOS 65  BILITOT 2.8*  PROT 8.4*  ALBUMIN 4.4   Recent Labs  Lab 04/30/22 0926  LIPASE 25    No results for input(s): "AMMONIA" in the last 168 hours. Coagulation Profile: No results for input(s): "INR", "PROTIME" in the last 168 hours. Cardiac Enzymes: No results for input(s): "CKTOTAL", "CKMB", "CKMBINDEX", "TROPONINI" in the last 168 hours. BNP (last 3 results) No results for input(s): "PROBNP" in the last 8760 hours. HbA1C: No results for input(s): "HGBA1C" in the last 72 hours. CBG: No results for input(s): "GLUCAP" in the last 168 hours.  Lipid Profile: No results for input(s): "CHOL", "HDL", "LDLCALC", "TRIG", "CHOLHDL", "LDLDIRECT" in the last 72 hours. Thyroid Function Tests: No results for input(s): "TSH", "T4TOTAL", "FREET4", "T3FREE", "THYROIDAB" in the last 72 hours. Anemia Panel: No results for input(s): "VITAMINB12", "FOLATE", "FERRITIN", "TIBC", "IRON", "RETICCTPCT" in the last 72 hours. Urine analysis:    Component Value Date/Time   COLORURINE YELLOW 04/30/2022 0926   APPEARANCEUR CLEAR 04/30/2022 0926   LABSPEC 1.014 04/30/2022 0926   PHURINE 7.0 04/30/2022 0926   GLUCOSEU NEGATIVE 04/30/2022 0926   HGBUR NEGATIVE 04/30/2022 0926   BILIRUBINUR NEGATIVE 04/30/2022 0926   BILIRUBINUR neg 07/05/2013 1735   KETONESUR 20 (A) 04/30/2022 0926   PROTEINUR 30 (A) 04/30/2022 0926   UROBILINOGEN negative 07/05/2013 1735   NITRITE NEGATIVE 04/30/2022 0926   LEUKOCYTESUR NEGATIVE 04/30/2022 0926    Radiological Exams on Admission: CT ABDOMEN PELVIS W CONTRAST  Result Date: 04/30/2022 CLINICAL DATA:  Right-sided abdominal pain and nausea EXAM: CT ABDOMEN AND PELVIS WITH CONTRAST TECHNIQUE: Multidetector CT imaging of the abdomen and pelvis was performed using the standard protocol following bolus administration of intravenous contrast. RADIATION DOSE REDUCTION: This exam was performed according to the departmental dose-optimization program which includes automated exposure control, adjustment of the mA and/or kV according to patient size and/or use of iterative  reconstruction technique. CONTRAST:  189m OMNIPAQUE IOHEXOL 300 MG/ML  SOLN COMPARISON:  None Available. FINDINGS: Lower chest: Normal heart size no pericardial effusion. The visualized bibasilar lungs are clear. Hepatobiliary: No focal liver abnormality is seen. Mild focal fat deposition near the falciform ligament. No gallstones, gallbladder wall thickening, or biliary dilatation. Pancreas: Mild focal pancreatic ductal dilation measuring up to 8 mm (series 2, image 35), without an obstructive lesion identified. No peripancreatic fat stranding. Spleen: Normal in size without focal abnormality. Adrenals/Urinary Tract: Incidental visualization of an 11 mm right adrenal nodule (series 2, image 30) and an 11 mm left adrenal nodule (series 2, image 29), both with Hounsfield units of about 76 in the postcontrast study. Symmetric enhancement of bilateral kidneys and no hydronephrosis. The urinary bladder is normal in appearance. Stomach/Bowel: Extensive sigmoid colon diverticulosis. Focal bowel wall thickening of 2 adjacent loops of sigmoid colon in the right lower quadrant with moderate surrounding fat stranding (series 2, image 68; series 5, image 41). No definite free air or adjacent fluid collection. No bowel obstruction. Normal appearance of the appendix. Vascular/Lymphatic: Normal course and caliber of the abdominal aorta and its branches. Severe calcified atherosclerosis. Reproductive: Coarse calcifications within the prostate gland. Other: No abdominal wall hernia or abnormality. No abdominopelvic ascites. Musculoskeletal: No acute or significant osseous findings. IMPRESSION: 1. Acute uncomplicated sigmoid diverticulitis involving 2 adjacent loops of sigmoid colon in the right lower quadrant. 2. Mild focal pancreatic ductal dilation without an obstructing lesion identified. This is of uncertain clinical significance. Consider correlation with lipase/LFTs and further assessment with MRCP in the non-emergent  setting. 3. Incidentally visualized bilateral adrenal nodules, likely benign adenomas. Recommend 1 year follow up adrenal washout CT. If stable for > 1 year, no further follow-up imaging. JACR 2017 Aug; 14(8):1038-44, JCAT 2016 Mar-Apr; 40(2):194-200, Urol J 2006 Spring; 3(2):71-4. Electronically Signed   By: MBeryle FlockM.D.   On: 04/30/2022 11:03     Assessment/Plan Principal Problem:   Diverticulitis Active Problems:   Hypokalemia   Essential hypertension    Acute uncomplicated sigmoid diverticulitis -Treat with Unasyn -Continue to monitor  Severe hypokalemia -Replete and follow labs  Hypertension -Continue home blood pressure agents with the  exception of HCTZ as noted below  Mild hyponatremia -Possibly secondary to HCTZ use -Hold this moving forward  History of anxiety -Continue home medications  Dyslipidemia -Continue home medications   DVT prophylaxis: Lovenox Code Status: Full Family Communication: None Disposition Plan:Admit for diverticulitis treatment Consults called:None Admission status: Obs, Tele  Severity of Illness: The appropriate patient status for this patient is OBSERVATION. Observation status is judged to be reasonable and necessary in order to provide the required intensity of service to ensure the patient's safety. The patient's presenting symptoms, physical exam findings, and initial radiographic and laboratory data in the context of their medical condition is felt to place them at decreased risk for further clinical deterioration. Furthermore, it is anticipated that the patient will be medically stable for discharge from the hospital within 2 midnights of admission.    Shalisha Clausing D Manuella Ghazi DO [facility]  If 7PM-7AM, please contact night-coverage [email or web address]  04/30/2022, 12:42 PM

## 2022-04-30 NOTE — ED Notes (Signed)
Placed on CCM

## 2022-05-01 DIAGNOSIS — K5792 Diverticulitis of intestine, part unspecified, without perforation or abscess without bleeding: Secondary | ICD-10-CM | POA: Diagnosis not present

## 2022-05-01 LAB — CBC
HCT: 37.9 % — ABNORMAL LOW (ref 39.0–52.0)
Hemoglobin: 12.9 g/dL — ABNORMAL LOW (ref 13.0–17.0)
MCH: 31.6 pg (ref 26.0–34.0)
MCHC: 34 g/dL (ref 30.0–36.0)
MCV: 92.9 fL (ref 80.0–100.0)
Platelets: 221 10*3/uL (ref 150–400)
RBC: 4.08 MIL/uL — ABNORMAL LOW (ref 4.22–5.81)
RDW: 13.5 % (ref 11.5–15.5)
WBC: 13.3 10*3/uL — ABNORMAL HIGH (ref 4.0–10.5)
nRBC: 0 % (ref 0.0–0.2)

## 2022-05-01 LAB — BASIC METABOLIC PANEL
Anion gap: 7 (ref 5–15)
BUN: 12 mg/dL (ref 8–23)
CO2: 25 mmol/L (ref 22–32)
Calcium: 8.5 mg/dL — ABNORMAL LOW (ref 8.9–10.3)
Chloride: 107 mmol/L (ref 98–111)
Creatinine, Ser: 0.81 mg/dL (ref 0.61–1.24)
GFR, Estimated: >60 mL/min (ref >60)
Glucose, Bld: 114 mg/dL — ABNORMAL HIGH (ref 70–99)
Potassium: 3.6 mmol/L (ref 3.5–5.1)
Sodium: 139 mmol/L (ref 135–145)

## 2022-05-01 LAB — MAGNESIUM: Magnesium: 2.2 mg/dL (ref 1.7–2.4)

## 2022-05-01 MED ORDER — AMOXICILLIN-POT CLAVULANATE 875-125 MG PO TABS
1.0000 | ORAL_TABLET | Freq: Two times a day (BID) | ORAL | 0 refills | Status: AC
Start: 2022-05-01 — End: 2022-05-08

## 2022-05-01 MED ORDER — LOSARTAN POTASSIUM 50 MG PO TABS: 50.0000 mg | ORAL_TABLET | Freq: Every day | ORAL | 0 refills | Status: AC

## 2022-05-01 NOTE — TOC Progression Note (Signed)
  Transition of Care [facility]) Screening Note   Patient Details  Name: Nilesh Stegall Date of Birth: [birth year]   Transition of Care [facility]) CM/SW Contact:    Boneta Lucks, RN Phone Number: 05/01/2022, 10:03 AM    Transition of Care Department [facility]) has reviewed patient and no TOC needs have been identified at this time. We will continue to monitor patient advancement through interdisciplinary progression rounds. If new patient transition needs arise, please place a TOC consult.        Expected Discharge Date: 05/01/22

## 2022-05-01 NOTE — Discharge Summary (Signed)
Physician Discharge Summary  Delmont Prosch IOE:[id number] DOB: 1945/10/06 DOA: 04/30/2022  PCP: Doyle Askew, PA-C  Admit date: 04/30/2022  Discharge date: 05/01/2022  Admitted From:Home  Disposition:  Home  Recommendations for Outpatient Follow-up:  Follow up with PCP in 1-2 weeks Continue on Augmentin for 7 more days as prescribed for treatment of diverticulitis Continue on other home medications as prior and hold HCTZ given increased urination and mild hyponatremia Continue other home blood pressure medications and monitor blood pressure closely in outpatient setting and consider restarting amlodipine as previously prescribed if needed  Home Health: None  Equipment/Devices: None  Discharge Condition:Stable  CODE STATUS: Full  Diet recommendation: Heart Healthy  Brief/Interim Summary: Carlos Grimes is a 76 y.o. male with medical history significant for anxiety, dyslipidemia, and hypertension, who presented to the ED with complaints of lower abdominal pain with nausea no vomiting over the last 3 days.  He was admitted for acute uncomplicated sigmoid diverticulitis as well as severe hypokalemia and mild hyponatremia.  He was started on treatment with Unasyn and no longer has any nausea or vomiting, but continues to have some mild diarrhea and lower abdominal pain.  He feels that he is safe to discharge home and this appears to be the case as leukocytosis has been improving and electrolyte abnormalities have been corrected.  He will remain on Augmentin as noted above for several more days to complete course of treatment and he has been instructed to remain off of HCTZ given his electrolyte abnormalities and increased urination.  His blood pressures will need to be carefully monitored in the outpatient setting.  No other acute events noted throughout the course of this admission.  Discharge Diagnoses:  Principal Problem:   Diverticulitis Active Problems:   Hypokalemia    Essential hypertension  Principal discharge diagnosis: Acute uncomplicated sigmoid diverticulitis with electrolyte abnormalities related to HCTZ use as well as diarrhea.  Discharge Instructions  Discharge Instructions     Diet - low sodium heart healthy   Complete by: As directed    Increase activity slowly   Complete by: As directed       Allergies as of 05/01/2022       Reactions   Prednisone    Other reaction(s): Chest Pain, Confusion, Dizziness, Hallucinations, Hypertension, Nervous   Zoloft [sertraline Hcl] Shortness Of Breath, Rash, Other (See Comments)   Tightness in chest, chest pain   Pregabalin Swelling   Amitriptyline Other (See Comments)   Caused poisioning in system   Cymbalta [duloxetine Hcl] Other (See Comments)   Unable to urinate   Gabapentin    Dream   Hydromorphone Hcl Other (See Comments)   halucinations   Statins    Joint, pain        Medication List     STOP taking these medications    amLODipine 10 MG tablet Commonly known as: NORVASC   losartan-hydrochlorothiazide 50-12.5 MG tablet Commonly known as: HYZAAR       TAKE these medications    amoxicillin-clavulanate 875-125 MG tablet Commonly known as: AUGMENTIN Take 1 tablet by mouth 2 (two) times daily for 7 days.   b complex vitamins capsule Take 1 capsule by mouth daily.   baclofen 10 MG tablet Commonly known as: LIORESAL TAKE ONE TABLET BY MOUTH FOUR (4) TIMES DAILY   clonazePAM 0.5 MG tablet Commonly known as: KLONOPIN TAKE ONE TABLET BY MOUTH TWICE DAILY AS NEEDED FOR ANXIETY   cloNIDine 0.1 MG tablet Commonly known as: CATAPRES Take 0.1 mg  by mouth 2 (two) times daily.   Co Q 10 100 MG Caps Take 1 capsule by mouth 2 (two) times daily.   diclofenac sodium 1 % Gel Commonly known as: VOLTAREN APPLY 2 GRAMS TOPICALLY FOUR (4) TIMES DAILY   Garlic Oil 2 MG Caps Take 1 tablet by mouth.   Iron 18 MG Tbcr Take 1 tablet by mouth daily.   losartan 50 MG  tablet Commonly known as: COZAAR Take 1 tablet (50 mg total) by mouth daily.   multivitamin with minerals Tabs tablet Take 1 tablet by mouth daily.   nystatin cream Commonly known as: MYCOSTATIN Apply 1 application topically 2 (two) times daily. Apply to sores on feet   Red Yeast Rice 600 MG Caps Take 1 capsule by mouth daily.   SALMON OIL-1000 PO Take 1,000 mg by mouth daily.   Turmeric 450 MG Caps Take 1 tablet by mouth.   Xyzal 5 MG tablet Generic drug: levocetirizine Take 1 tablet by mouth daily. In the pm        Follow-up Information     Doyle Askew, PA-C. Schedule an appointment as soon as possible for a visit in 1 week(s).   Specialty: Physician Assistant Contact information: [address] [place] [address] [phone]                Allergies  Allergen Reactions   Prednisone     Other reaction(s): Chest Pain, Confusion, Dizziness, Hallucinations, Hypertension, Nervous   Zoloft [Sertraline Hcl] Shortness Of Breath, Rash and Other (See Comments)    Tightness in chest, chest pain   Pregabalin Swelling   Amitriptyline Other (See Comments)    Caused poisioning in system   Cymbalta [Duloxetine Hcl] Other (See Comments)    Unable to urinate   Gabapentin     Dream   Hydromorphone Hcl Other (See Comments)    halucinations   Statins     Joint, pain    Consultations: None   Procedures/Studies: CT ABDOMEN PELVIS W CONTRAST  Result Date: 04/30/2022 CLINICAL DATA:  Right-sided abdominal pain and nausea EXAM: CT ABDOMEN AND PELVIS WITH CONTRAST TECHNIQUE: Multidetector CT imaging of the abdomen and pelvis was performed using the standard protocol following bolus administration of intravenous contrast. RADIATION DOSE REDUCTION: This exam was performed according to the departmental dose-optimization program which includes automated exposure control, adjustment of the mA and/or kV according to patient size and/or use of iterative  reconstruction technique. CONTRAST:  132m OMNIPAQUE IOHEXOL 300 MG/ML  SOLN COMPARISON:  None Available. FINDINGS: Lower chest: Normal heart size no pericardial effusion. The visualized bibasilar lungs are clear. Hepatobiliary: No focal liver abnormality is seen. Mild focal fat deposition near the falciform ligament. No gallstones, gallbladder wall thickening, or biliary dilatation. Pancreas: Mild focal pancreatic ductal dilation measuring up to 8 mm (series 2, image 35), without an obstructive lesion identified. No peripancreatic fat stranding. Spleen: Normal in size without focal abnormality. Adrenals/Urinary Tract: Incidental visualization of an 11 mm right adrenal nodule (series 2, image 30) and an 11 mm left adrenal nodule (series 2, image 29), both with Hounsfield units of about 76 in the postcontrast study. Symmetric enhancement of bilateral kidneys and no hydronephrosis. The urinary bladder is normal in appearance. Stomach/Bowel: Extensive sigmoid colon diverticulosis. Focal bowel wall thickening of 2 adjacent loops of sigmoid colon in the right lower quadrant with moderate surrounding fat stranding (series 2, image 68; series 5, image 41). No definite free air or adjacent fluid collection. No  bowel obstruction. Normal appearance of the appendix. Vascular/Lymphatic: Normal course and caliber of the abdominal aorta and its branches. Severe calcified atherosclerosis. Reproductive: Coarse calcifications within the prostate gland. Other: No abdominal wall hernia or abnormality. No abdominopelvic ascites. Musculoskeletal: No acute or significant osseous findings. IMPRESSION: 1. Acute uncomplicated sigmoid diverticulitis involving 2 adjacent loops of sigmoid colon in the right lower quadrant. 2. Mild focal pancreatic ductal dilation without an obstructing lesion identified. This is of uncertain clinical significance. Consider correlation with lipase/LFTs and further assessment with MRCP in the non-emergent  setting. 3. Incidentally visualized bilateral adrenal nodules, likely benign adenomas. Recommend 1 year follow up adrenal washout CT. If stable for > 1 year, no further follow-up imaging. JACR 2017 Aug; 14(8):1038-44, JCAT 2016 Mar-Apr; 40(2):194-200, Urol J 2006 Spring; 3(2):71-4. Electronically Signed   By: Beryle Flock M.D.   On: 04/30/2022 11:03     Discharge Exam: Vitals:   04/30/22 2015 05/01/22 0532  BP: (!) 144/80 (!) 141/79  Pulse: 77 72  Resp: 20 20  Temp: 98.5 F (36.9 C) 98.8 F (37.1 C)  SpO2: 94% 95%   Vitals:   04/30/22 1311 04/30/22 1744 04/30/22 2015 05/01/22 0532  BP: (!) 147/87 (!) 146/89 (!) 144/80 (!) 141/79  Pulse: 81 82 77 72  Resp: '20 18 20 20  '$ Temp: 98.4 F (36.9 C) 98.4 F (36.9 C) 98.5 F (36.9 C) 98.8 F (37.1 C)  TempSrc: Oral Oral    SpO2: 97% 95% 94% 95%  Weight: 69.4 kg     Height: '5\' 11"'$  (1.803 m)       General: Pt is alert, awake, not in acute distress Cardiovascular: RRR, S1/S2 +, no rubs, no gallops Respiratory: CTA bilaterally, no wheezing, no rhonchi Abdominal: Soft, NT, ND, bowel sounds + Extremities: no edema, no cyanosis    The results of significant diagnostics from this hospitalization (including imaging, microbiology, ancillary and laboratory) are listed below for reference.     Microbiology: No results found for this or any previous visit (from the past 240 hour(s)).   Labs: BNP (last 3 results) No results for input(s): "BNP" in the last 8760 hours. Basic Metabolic Panel: Recent Labs  Lab 04/30/22 0926 04/30/22 1324 05/01/22 0420  NA 130*  --  139  K 2.5*  --  3.6  CL 93*  --  107  CO2 26  --  25  GLUCOSE 140*  --  114*  BUN 14  --  12  CREATININE 0.84 0.92 0.81  CALCIUM 9.1  --  8.5*  MG  --   --  2.2   Liver Function Tests: Recent Labs  Lab 04/30/22 0926  AST 27  ALT 25  ALKPHOS 65  BILITOT 2.8*  PROT 8.4*  ALBUMIN 4.4   Recent Labs  Lab 04/30/22 0926  LIPASE 25   No results for input(s):  "AMMONIA" in the last 168 hours. CBC: Recent Labs  Lab 04/30/22 0926 04/30/22 1324 05/01/22 0420  WBC 19.7* 18.9* 13.3*  NEUTROABS 16.8*  --   --   HGB 14.5 14.5 12.9*  HCT 41.2 42.8 37.9*  MCV 90.4 92.6 92.9  PLT 273 257 221   Cardiac Enzymes: No results for input(s): "CKTOTAL", "CKMB", "CKMBINDEX", "TROPONINI" in the last 168 hours. BNP: Invalid input(s): "POCBNP" CBG: No results for input(s): "GLUCAP" in the last 168 hours. D-Dimer No results for input(s): "DDIMER" in the last 72 hours. Hgb A1c No results for input(s): "HGBA1C" in the last 72 hours. Lipid Profile No  results for input(s): "CHOL", "HDL", "LDLCALC", "TRIG", "CHOLHDL", "LDLDIRECT" in the last 72 hours. Thyroid function studies No results for input(s): "TSH", "T4TOTAL", "T3FREE", "THYROIDAB" in the last 72 hours.  Invalid input(s): "FREET3" Anemia work up No results for input(s): "VITAMINB12", "FOLATE", "FERRITIN", "TIBC", "IRON", "RETICCTPCT" in the last 72 hours. Urinalysis    Component Value Date/Time   COLORURINE YELLOW 04/30/2022 0926   APPEARANCEUR CLEAR 04/30/2022 0926   LABSPEC 1.014 04/30/2022 0926   PHURINE 7.0 04/30/2022 0926   GLUCOSEU NEGATIVE 04/30/2022 0926   HGBUR NEGATIVE 04/30/2022 0926   BILIRUBINUR NEGATIVE 04/30/2022 0926   BILIRUBINUR neg 07/05/2013 1735   KETONESUR 20 (A) 04/30/2022 0926   PROTEINUR 30 (A) 04/30/2022 0926   UROBILINOGEN negative 07/05/2013 1735   NITRITE NEGATIVE 04/30/2022 0926   LEUKOCYTESUR NEGATIVE 04/30/2022 0926   Sepsis Labs Recent Labs  Lab 04/30/22 0926 04/30/22 1324 05/01/22 0420  WBC 19.7* 18.9* 13.3*   Microbiology No results found for this or any previous visit (from the past 240 hour(s)).   Time coordinating discharge: 35 minutes  SIGNED:   Rodena Goldmann, DO [facility] 05/01/2022, 8:30 AM  If 7PM-7AM, please contact night-coverage [email or web address]

## 2022-05-04 DIAGNOSIS — Z888 Allergy status to other drugs, medicaments and biological substances status: Secondary | ICD-10-CM | POA: Diagnosis not present

## 2022-05-04 DIAGNOSIS — M1990 Unspecified osteoarthritis, unspecified site: Secondary | ICD-10-CM | POA: Diagnosis not present

## 2022-05-04 DIAGNOSIS — I10 Essential (primary) hypertension: Secondary | ICD-10-CM | POA: Diagnosis not present

## 2022-05-04 DIAGNOSIS — Z79899 Other long term (current) drug therapy: Secondary | ICD-10-CM | POA: Diagnosis not present

## 2022-05-04 DIAGNOSIS — H9190 Unspecified hearing loss, unspecified ear: Secondary | ICD-10-CM | POA: Diagnosis not present

## 2022-05-04 DIAGNOSIS — E871 Hypo-osmolality and hyponatremia: Secondary | ICD-10-CM | POA: Diagnosis not present

## 2022-05-04 DIAGNOSIS — K5720 Diverticulitis of large intestine with perforation and abscess without bleeding: Secondary | ICD-10-CM | POA: Diagnosis not present

## 2022-05-04 DIAGNOSIS — M25511 Pain in right shoulder: Secondary | ICD-10-CM | POA: Diagnosis not present

## 2022-05-04 DIAGNOSIS — K3533 Acute appendicitis with perforation and localized peritonitis, with abscess: Secondary | ICD-10-CM | POA: Diagnosis not present

## 2022-05-04 DIAGNOSIS — E876 Hypokalemia: Secondary | ICD-10-CM | POA: Diagnosis not present

## 2022-05-04 DIAGNOSIS — D72829 Elevated white blood cell count, unspecified: Secondary | ICD-10-CM | POA: Diagnosis not present

## 2022-05-04 DIAGNOSIS — D649 Anemia, unspecified: Secondary | ICD-10-CM | POA: Diagnosis not present

## 2022-05-04 DIAGNOSIS — K5989 Other specified functional intestinal disorders: Secondary | ICD-10-CM | POA: Diagnosis not present

## 2022-05-04 DIAGNOSIS — E782 Mixed hyperlipidemia: Secondary | ICD-10-CM | POA: Diagnosis not present

## 2022-05-04 DIAGNOSIS — D3501 Benign neoplasm of right adrenal gland: Secondary | ICD-10-CM | POA: Diagnosis not present

## 2022-05-04 DIAGNOSIS — G8929 Other chronic pain: Secondary | ICD-10-CM | POA: Diagnosis not present

## 2022-05-04 DIAGNOSIS — I69398 Other sequelae of cerebral infarction: Secondary | ICD-10-CM | POA: Diagnosis not present

## 2022-05-04 DIAGNOSIS — K651 Peritoneal abscess: Secondary | ICD-10-CM | POA: Diagnosis not present

## 2022-05-04 DIAGNOSIS — Z7982 Long term (current) use of aspirin: Secondary | ICD-10-CM | POA: Diagnosis not present

## 2022-05-04 DIAGNOSIS — K5730 Diverticulosis of large intestine without perforation or abscess without bleeding: Secondary | ICD-10-CM | POA: Diagnosis not present

## 2022-05-04 DIAGNOSIS — R1031 Right lower quadrant pain: Secondary | ICD-10-CM | POA: Diagnosis not present

## 2022-05-04 DIAGNOSIS — Z20822 Contact with and (suspected) exposure to covid-19: Secondary | ICD-10-CM | POA: Diagnosis not present

## 2022-05-04 DIAGNOSIS — D3502 Benign neoplasm of left adrenal gland: Secondary | ICD-10-CM | POA: Diagnosis not present

## 2022-05-04 DIAGNOSIS — E278 Other specified disorders of adrenal gland: Secondary | ICD-10-CM | POA: Diagnosis not present

## 2022-05-04 DIAGNOSIS — K630 Abscess of intestine: Secondary | ICD-10-CM | POA: Diagnosis not present

## 2022-05-20 DIAGNOSIS — Z09 Encounter for follow-up examination after completed treatment for conditions other than malignant neoplasm: Secondary | ICD-10-CM | POA: Diagnosis not present

## 2022-05-20 DIAGNOSIS — K3533 Acute appendicitis with perforation and localized peritonitis, with abscess: Secondary | ICD-10-CM | POA: Diagnosis not present

## 2022-05-28 DIAGNOSIS — K5730 Diverticulosis of large intestine without perforation or abscess without bleeding: Secondary | ICD-10-CM | POA: Diagnosis not present

## 2022-05-28 DIAGNOSIS — K3533 Acute appendicitis with perforation and localized peritonitis, with abscess: Secondary | ICD-10-CM | POA: Diagnosis not present

## 2022-05-28 DIAGNOSIS — E278 Other specified disorders of adrenal gland: Secondary | ICD-10-CM | POA: Diagnosis not present

## 2022-05-30 DIAGNOSIS — R1031 Right lower quadrant pain: Secondary | ICD-10-CM | POA: Diagnosis not present

## 2022-05-30 DIAGNOSIS — Z8673 Personal history of transient ischemic attack (TIA), and cerebral infarction without residual deficits: Secondary | ICD-10-CM | POA: Diagnosis not present

## 2022-05-30 DIAGNOSIS — Z86010 Personal history of colonic polyps: Secondary | ICD-10-CM | POA: Diagnosis not present

## 2022-05-30 DIAGNOSIS — I10 Essential (primary) hypertension: Secondary | ICD-10-CM | POA: Diagnosis not present

## 2022-06-02 DIAGNOSIS — K37 Unspecified appendicitis: Secondary | ICD-10-CM | POA: Diagnosis not present

## 2022-06-04 DIAGNOSIS — G8111 Spastic hemiplegia affecting right dominant side: Secondary | ICD-10-CM | POA: Diagnosis not present

## 2022-06-04 DIAGNOSIS — R531 Weakness: Secondary | ICD-10-CM | POA: Diagnosis not present

## 2022-06-04 DIAGNOSIS — Z0000 Encounter for general adult medical examination without abnormal findings: Secondary | ICD-10-CM | POA: Diagnosis not present

## 2022-06-04 DIAGNOSIS — E782 Mixed hyperlipidemia: Secondary | ICD-10-CM | POA: Diagnosis not present

## 2022-06-04 DIAGNOSIS — D509 Iron deficiency anemia, unspecified: Secondary | ICD-10-CM | POA: Diagnosis not present

## 2022-06-04 DIAGNOSIS — R238 Other skin changes: Secondary | ICD-10-CM | POA: Diagnosis not present
